# Patient Record
Sex: Female | Born: 1995 | Race: Black or African American | Hispanic: No | Marital: Single | Smoking: Current every day smoker
Health system: Southern US, Community
[De-identification: ages and names within clinical notes are randomized; demographics above are authoritative.]

## PROBLEM LIST (undated history)

## (undated) ENCOUNTER — Inpatient Hospital Stay (HOSPITAL_COMMUNITY): Payer: Self-pay

## (undated) DIAGNOSIS — J45909 Unspecified asthma, uncomplicated: Secondary | ICD-10-CM

## (undated) DIAGNOSIS — N739 Female pelvic inflammatory disease, unspecified: Secondary | ICD-10-CM

## (undated) DIAGNOSIS — F329 Major depressive disorder, single episode, unspecified: Secondary | ICD-10-CM

## (undated) DIAGNOSIS — J302 Other seasonal allergic rhinitis: Secondary | ICD-10-CM

## (undated) DIAGNOSIS — F32A Depression, unspecified: Secondary | ICD-10-CM

## (undated) DIAGNOSIS — G4730 Sleep apnea, unspecified: Secondary | ICD-10-CM

## (undated) DIAGNOSIS — B9689 Other specified bacterial agents as the cause of diseases classified elsewhere: Secondary | ICD-10-CM

## (undated) DIAGNOSIS — R51 Headache: Secondary | ICD-10-CM

## (undated) DIAGNOSIS — K5900 Constipation, unspecified: Secondary | ICD-10-CM

## (undated) DIAGNOSIS — N760 Acute vaginitis: Secondary | ICD-10-CM

## (undated) DIAGNOSIS — D649 Anemia, unspecified: Secondary | ICD-10-CM

## (undated) HISTORY — DX: Unspecified asthma, uncomplicated: J45.909

## (undated) HISTORY — DX: Major depressive disorder, single episode, unspecified: F32.9

## (undated) HISTORY — DX: Depression, unspecified: F32.A

## (undated) HISTORY — DX: Anemia, unspecified: D64.9

## (undated) HISTORY — DX: Sleep apnea, unspecified: G47.30

---
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
## 2014-01-20 ENCOUNTER — Emergency Department (HOSPITAL_COMMUNITY)
Admission: EM | Admit: 2014-01-20 | Discharge: 2014-01-20 | Disposition: A | Discharge status: Home / Self Care | Payer: Medicaid Other | Attending: Emergency Medicine | Admitting: Emergency Medicine

## 2014-01-20 ENCOUNTER — Encounter (HOSPITAL_COMMUNITY): Payer: Self-pay | Admitting: *Deleted

## 2014-01-20 ENCOUNTER — Emergency Department (HOSPITAL_COMMUNITY): Payer: Medicaid Other

## 2014-01-20 DIAGNOSIS — R6883 Chills (without fever): Secondary | ICD-10-CM | POA: Insufficient documentation

## 2014-01-20 DIAGNOSIS — K5900 Constipation, unspecified: Secondary | ICD-10-CM

## 2014-01-20 DIAGNOSIS — Z3202 Encounter for pregnancy test, result negative: Secondary | ICD-10-CM | POA: Diagnosis not present

## 2014-01-20 DIAGNOSIS — Z8742 Personal history of other diseases of the female genital tract: Secondary | ICD-10-CM | POA: Insufficient documentation

## 2014-01-20 DIAGNOSIS — J45909 Unspecified asthma, uncomplicated: Secondary | ICD-10-CM | POA: Insufficient documentation

## 2014-01-20 DIAGNOSIS — R109 Unspecified abdominal pain: Secondary | ICD-10-CM | POA: Diagnosis present

## 2014-01-20 LAB — COMPREHENSIVE METABOLIC PANEL
ALBUMIN: 3.2 g/dL — AB (ref 3.5–5.2)
ALT: 13 U/L (ref 0–35)
AST: 19 U/L (ref 0–37)
Alkaline Phosphatase: 87 U/L (ref 39–117)
Anion gap: 11 (ref 5–15)
BUN: 9 mg/dL (ref 6–23)
CO2: 24 meq/L (ref 19–32)
CREATININE: 0.64 mg/dL (ref 0.50–1.10)
Calcium: 8.9 mg/dL (ref 8.4–10.5)
Chloride: 103 mEq/L (ref 96–112)
GFR calc Af Amer: >90 mL/min (ref >90)
Glucose, Bld: 86 mg/dL (ref 70–99)
Potassium: 4.2 mEq/L (ref 3.7–5.3)
Sodium: 138 mEq/L (ref 137–147)
Total Protein: 6.9 g/dL (ref 6.0–8.3)

## 2014-01-20 LAB — CBC WITH DIFFERENTIAL/PLATELET
Basophils Absolute: 0 10*3/uL (ref 0.0–0.1)
Basophils Relative: 1 % (ref 0–1)
Eosinophils Absolute: 0.3 10*3/uL (ref 0.0–0.7)
Eosinophils Relative: 7 % — ABNORMAL HIGH (ref 0–5)
HEMATOCRIT: 39 % (ref 36.0–46.0)
HEMOGLOBIN: 12.8 g/dL (ref 12.0–15.0)
LYMPHS PCT: 57 % — AB (ref 12–46)
Lymphs Abs: 2.5 10*3/uL (ref 0.7–4.0)
MCH: 28.8 pg (ref 26.0–34.0)
MCHC: 32.8 g/dL (ref 30.0–36.0)
MCV: 87.8 fL (ref 78.0–100.0)
MONO ABS: 0.5 10*3/uL (ref 0.1–1.0)
Monocytes Relative: 11 % (ref 3–12)
NEUTROS PCT: 24 % — AB (ref 43–77)
Neutro Abs: 1 10*3/uL — ABNORMAL LOW (ref 1.7–7.7)
Platelets: 193 10*3/uL (ref 150–400)
RBC: 4.44 MIL/uL (ref 3.87–5.11)
RDW: 12.2 % (ref 11.5–15.5)
WBC: 4.3 10*3/uL (ref 4.0–10.5)

## 2014-01-20 LAB — URINALYSIS, ROUTINE W REFLEX MICROSCOPIC
BILIRUBIN URINE: NEGATIVE
GLUCOSE, UA: NEGATIVE mg/dL
HGB URINE DIPSTICK: NEGATIVE
KETONES UR: NEGATIVE mg/dL
Leukocytes, UA: NEGATIVE
NITRITE: NEGATIVE
PH: 6 (ref 5.0–8.0)
Protein, ur: NEGATIVE mg/dL
SPECIFIC GRAVITY, URINE: 1.011 (ref 1.005–1.030)
Urobilinogen, UA: 1 mg/dL (ref 0.0–1.0)

## 2014-01-20 LAB — LIPASE, BLOOD: LIPASE: 43 U/L (ref 11–59)

## 2014-01-20 LAB — PREGNANCY, URINE: Preg Test, Ur: NEGATIVE

## 2014-01-20 MED ORDER — FLEET ENEMA 7-19 GM/118ML RE ENEM: 1.0000 | ENEMA | Freq: Every day | RECTAL | Status: DC | PRN

## 2014-01-20 MED ORDER — DICYCLOMINE HCL 10 MG PO CAPS
10.0000 mg | ORAL_CAPSULE | Freq: Once | ORAL | Status: AC
  Administered 2014-01-20: 10 mg via ORAL
  Filled 2014-01-20: qty 1

## 2014-01-20 MED ORDER — MAGNESIUM CITRATE PO SOLN: 1.0000 | Freq: Once | ORAL | Status: DC

## 2014-01-20 NOTE — ED Provider Notes (Signed)
CSN: [id number]     Arrival date & time 01/20/14  1618 History   First MD Initiated Contact with Patient 01/20/14 1921     Chief Complaint  Patient presents with  . Abdominal Pain     (Consider location/radiation/quality/duration/timing/severity/associated sxs/prior Treatment) HPI Erica Harrell is a 18 y.o. female who presents to emergency department complaining of abdominal pain. Patient states her pain began 3 days ago. She admits to associated body aches and chills. States today pain worsens. Patient states that her sister told her that she ate "moldy cake." She denies any associated nausea or vomiting. She states she is unable tell bowel movement. Last bowel movement a week ago, however patient states that it is not unusual for her. She reports she has been hospitalized twice for constipation in the past. Patient denies taking any medications prior to coming in. She denies any back pain. No urinary symptoms. No abnormal vaginal discharge or bleeding. Pain is "crampy" mainly in the upper abdomen. Patient states nothing is making her symptoms better or worse.  Past Medical History  Diagnosis Date  . Constipation   . Asthma   . Bacterial vaginosis   . Seasonal allergies   . Headache(784.0)   . Pelvic inflammatory disease    Past Surgical History  Procedure Laterality Date  . Vaginal delivery     History reviewed. No pertinent family history. History  Substance Use Topics  . Smoking status: Never Smoker   . Smokeless tobacco: Not on file  . Alcohol Use: No   OB History    Gravida Para Term Preterm AB TAB SAB Ectopic Multiple Living   1 1 1       1      Review of Systems  Constitutional: Positive for chills. Negative for fever.  Respiratory: Negative for cough, chest tightness and shortness of breath.   Cardiovascular: Negative for chest pain, palpitations and leg swelling.  Gastrointestinal: Positive for abdominal pain and constipation. Negative for nausea, vomiting and  diarrhea.  Genitourinary: Negative for dysuria, flank pain, vaginal bleeding, vaginal discharge, vaginal pain and pelvic pain.  Musculoskeletal: Negative for myalgias, arthralgias, neck pain and neck stiffness.  Skin: Negative for rash.  Neurological: Negative for dizziness, weakness and headaches.  All other systems reviewed and are negative.     Allergies  Shellfish allergy and Zoloft  Home Medications   Prior to Admission medications   Not on File   BP 111/64 mmHg  Pulse 77  Temp(Src) 98.8 F (37.1 C) (Oral)  Resp 16  SpO2 100% Physical Exam  Constitutional: She appears well-developed and well-nourished. No distress.  HENT:  Head: Normocephalic.  Eyes: Conjunctivae are normal.  Neck: Neck supple.  Cardiovascular: Normal rate, regular rhythm and normal heart sounds.   Pulmonary/Chest: Effort normal and breath sounds normal. No respiratory distress. She has no wheezes. She has no rales.  Abdominal: Soft. Bowel sounds are normal. She exhibits no distension. There is tenderness. There is no rebound.  Diffuse tenderness, worse in the left upper quadrant  Musculoskeletal: She exhibits no edema.  Neurological: She is alert.  Skin: Skin is warm and dry.  Psychiatric: She has a normal mood and affect. Her behavior is normal.  Nursing note and vitals reviewed.   ED Course  Procedures (including critical care time) Labs Review Labs Reviewed  CBC WITH DIFFERENTIAL - Abnormal; Notable for the following:    Neutrophils Relative % 24 (*)    Neutro Abs 1.0 (*)    Lymphocytes Relative 57 (*)  Eosinophils Relative 7 (*)    All other components within normal limits  COMPREHENSIVE METABOLIC PANEL - Abnormal; Notable for the following:    Albumin 3.2 (*)    Total Bilirubin <0.2 (*)    All other components within normal limits  URINALYSIS, ROUTINE W REFLEX MICROSCOPIC - Abnormal; Notable for the following:    APPearance CLOUDY (*)    All other components within normal limits   LIPASE, BLOOD  PREGNANCY, URINE  PREGNANCY, URINE    Imaging Review Dg Abd 2 Views  01/20/2014   CLINICAL DATA:  18 year old with abdominal pain for 2 days.  EXAM: ABDOMEN - 2 VIEW  COMPARISON:  05/21/2013  FINDINGS: Negative for free air. Large amount of stool in the abdomen. Gas in the stomach. Nonobstructive bowel gas pattern. No significant gas in the rectum.  IMPRESSION: Large amount of stool in the abdomen.   Electronically Signed   By: Richarda OverlieAdam  Henn M.D.   On: 01/20/2014 21:37     EKG Interpretation None      MDM   Final diagnoses:  Abdominal pain, acute  Constipation, unspecified constipation type    Patient with diffuse abdominal pain for several days, nausea, no bowel movement for a week. Labs and urinalysis obtained. Urine pregnancy obtained. Will also do plain abdomen film to rule out obstruction and to evaluate for stool burden   Patient's lab work and urinalysis unremarkable. Urine pregnancy is negative. X-ray showing large stool. Will treat her constipation, prescribed magnesium citrate, enema. At this time, no evidence of acute abdomen. Pt non toxic appearing  Filed Vitals:   01/20/14 2015 01/20/14 2030 01/20/14 2207 01/20/14 2232  BP: 110/71 116/78 115/66   Pulse: 80 88 78   Temp:    98.9 F (37.2 C)  TempSrc:    Oral  Resp:   16   SpO2: 100% 100% 100%      Myriam Jacobsonatyana A Romain Erion, PA-C 01/21/14 0000  Hilario Quarryanielle S Ray, MD 01/22/14 1141

## 2014-01-20 NOTE — ED Notes (Signed)
Patient transported to X-ray 

## 2014-01-20 NOTE — Discharge Instructions (Signed)
Take magnesium citrate and Fleet enema at the same time. Make sure to drink plenty of fluids. Mirapex daily. Follow with primary care doctor.  Constipation Constipation is when a person has fewer than three bowel movements a week, has difficulty having a bowel movement, or has stools that are dry, hard, or larger than normal. As people grow older, constipation is more common. If you try to fix constipation with medicines that make you have a bowel movement (laxatives), the problem may get worse. Long-term laxative use may cause the muscles of the colon to become weak. A low-fiber diet, not taking in enough fluids, and taking certain medicines may make constipation worse.  CAUSES   Certain medicines, such as antidepressants, pain medicine, iron supplements, antacids, and water pills.   Certain diseases, such as diabetes, irritable bowel syndrome (IBS), thyroid disease, or depression.   Not drinking enough water.   Not eating enough fiber-rich foods.   Stress or travel.   Lack of physical activity or exercise.   Ignoring the urge to have a bowel movement.   Using laxatives too much.  SIGNS AND SYMPTOMS   Having fewer than three bowel movements a week.   Straining to have a bowel movement.   Having stools that are hard, dry, or larger than normal.   Feeling full or bloated.   Pain in the lower abdomen.   Not feeling relief after having a bowel movement.  DIAGNOSIS  Your health care provider will take a medical history and perform a physical exam. Further testing may be done for severe constipation. Some tests may include:  A barium enema X-ray to examine your rectum, colon, and, sometimes, your small intestine.   A sigmoidoscopy to examine your lower colon.   A colonoscopy to examine your entire colon. TREATMENT  Treatment will depend on the severity of your constipation and what is causing it. Some dietary treatments include drinking more fluids and eating  more fiber-rich foods. Lifestyle treatments may include regular exercise. If these diet and lifestyle recommendations do not help, your health care provider may recommend taking over-the-counter laxative medicines to help you have bowel movements. Prescription medicines may be prescribed if over-the-counter medicines do not work.  HOME CARE INSTRUCTIONS   Eat foods that have a lot of fiber, such as fruits, vegetables, whole grains, and beans.  Limit foods high in fat and processed sugars, such as french fries, hamburgers, cookies, candies, and soda.   A fiber supplement may be added to your diet if you cannot get enough fiber from foods.   Drink enough fluids to keep your urine clear or pale yellow.   Exercise regularly or as directed by your health care provider.   Go to the restroom when you have the urge to go. Do not hold it.   Only take over-the-counter or prescription medicines as directed by your health care provider. Do not take other medicines for constipation without talking to your health care provider first.  SEEK IMMEDIATE MEDICAL CARE IF:   You have bright red blood in your stool.   Your constipation lasts for more than 4 days or gets worse.   You have abdominal or rectal pain.   You have thin, pencil-like stools.   You have unexplained weight loss. MAKE SURE YOU:   Understand these instructions.  Will watch your condition.  Will get help right away if you are not doing well or get worse. Document Released: 11/06/2003 Document Revised: 02/12/2013 Document Reviewed: 11/19/2012 [employer] Patient Information  2015 [employer]. This information is not intended to replace advice given to you by your health care provider. Make sure you discuss any questions you have with your health care provider. ° °

## 2014-01-20 NOTE — ED Notes (Signed)
Pt in c/o abd pain since Friday with chills and body aches, states she thinks she ate moldy cake a few days ago. Pt reports nausea but denies vomiting.

## 2014-05-10 ENCOUNTER — Emergency Department (HOSPITAL_COMMUNITY)
Admission: EM | Admit: 2014-05-10 | Discharge: 2014-05-11 | Disposition: A | Discharge status: Home / Self Care | Payer: Medicaid Other | Attending: Emergency Medicine | Admitting: Emergency Medicine

## 2014-05-10 ENCOUNTER — Encounter (HOSPITAL_COMMUNITY): Payer: Self-pay | Admitting: Emergency Medicine

## 2014-05-10 DIAGNOSIS — R079 Chest pain, unspecified: Secondary | ICD-10-CM | POA: Insufficient documentation

## 2014-05-10 DIAGNOSIS — F1010 Alcohol abuse, uncomplicated: Secondary | ICD-10-CM | POA: Insufficient documentation

## 2014-05-10 DIAGNOSIS — Y999 Unspecified external cause status: Secondary | ICD-10-CM | POA: Insufficient documentation

## 2014-05-10 DIAGNOSIS — Z3202 Encounter for pregnancy test, result negative: Secondary | ICD-10-CM | POA: Insufficient documentation

## 2014-05-10 DIAGNOSIS — Y939 Activity, unspecified: Secondary | ICD-10-CM | POA: Insufficient documentation

## 2014-05-10 DIAGNOSIS — T404X4A Poisoning by other synthetic narcotics, undetermined, initial encounter: Secondary | ICD-10-CM | POA: Diagnosis not present

## 2014-05-10 DIAGNOSIS — Z8742 Personal history of other diseases of the female genital tract: Secondary | ICD-10-CM | POA: Insufficient documentation

## 2014-05-10 DIAGNOSIS — K5900 Constipation, unspecified: Secondary | ICD-10-CM | POA: Diagnosis not present

## 2014-05-10 DIAGNOSIS — J45909 Unspecified asthma, uncomplicated: Secondary | ICD-10-CM | POA: Diagnosis not present

## 2014-05-10 DIAGNOSIS — IMO0001 Reserved for inherently not codable concepts without codable children: Secondary | ICD-10-CM

## 2014-05-10 DIAGNOSIS — Y929 Unspecified place or not applicable: Secondary | ICD-10-CM | POA: Diagnosis not present

## 2014-05-10 DIAGNOSIS — Z789 Other specified health status: Secondary | ICD-10-CM

## 2014-05-10 DIAGNOSIS — F10129 Alcohol abuse with intoxication, unspecified: Secondary | ICD-10-CM | POA: Diagnosis present

## 2014-05-10 LAB — BASIC METABOLIC PANEL
Anion gap: 5 (ref 5–15)
BUN: 10 mg/dL (ref 6–23)
CALCIUM: 8.6 mg/dL (ref 8.4–10.5)
CO2: 27 mmol/L (ref 19–32)
CREATININE: 0.76 mg/dL (ref 0.50–1.10)
Chloride: 105 mmol/L (ref 96–112)
GFR calc Af Amer: >90 mL/min (ref >90)
GFR calc non Af Amer: >90 mL/min (ref >90)
GLUCOSE: 86 mg/dL (ref 70–99)
Potassium: 3.5 mmol/L (ref 3.5–5.1)
Sodium: 137 mmol/L (ref 135–145)

## 2014-05-10 LAB — CBC
HCT: 37.8 % (ref 36.0–46.0)
Hemoglobin: 12.6 g/dL (ref 12.0–15.0)
MCH: 29.7 pg (ref 26.0–34.0)
MCHC: 33.3 g/dL (ref 30.0–36.0)
MCV: 89.2 fL (ref 78.0–100.0)
Platelets: 254 10*3/uL (ref 150–400)
RBC: 4.24 MIL/uL (ref 3.87–5.11)
RDW: 12 % (ref 11.5–15.5)
WBC: 6 10*3/uL (ref 4.0–10.5)

## 2014-05-10 LAB — I-STAT TROPONIN, ED: TROPONIN I, POC: 0 ng/mL (ref 0.00–0.08)
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
## 2014-05-11 LAB — RAPID URINE DRUG SCREEN, HOSP PERFORMED
AMPHETAMINES: NOT DETECTED
BENZODIAZEPINES: NOT DETECTED
Barbiturates: NOT DETECTED
Cocaine: NOT DETECTED
Opiates: NOT DETECTED
Tetrahydrocannabinol: NOT DETECTED

## 2014-05-11 LAB — POC URINE PREG, ED: Preg Test, Ur: NEGATIVE

## 2014-05-11 LAB — ETHANOL: ALCOHOL ETHYL (B): 34 mg/dL — AB (ref 0–9)

## 2014-05-11 NOTE — ED Notes (Signed)
MD at bedside. 

## 2014-05-11 NOTE — Discharge Instructions (Signed)
Return to the emergency department if you experience any new or worsening symptoms.   Alcohol Intoxication Alcohol intoxication occurs when the amount of alcohol that a person has consumed impairs his or her ability to mentally and physically function. Alcohol directly impairs the normal chemical activity of the brain. Drinking large amounts of alcohol can lead to changes in mental function and behavior, and it can cause many physical effects that can be harmful.  Alcohol intoxication can range in severity from mild to very severe. Various factors can affect the level of intoxication that occurs, such as the person's age, gender, weight, frequency of alcohol consumption, and the presence of other medical conditions (such as diabetes, seizures, or heart conditions). Dangerous levels of alcohol intoxication may occur when people drink large amounts of alcohol in a short period (binge drinking). Alcohol can also be especially dangerous when combined with certain prescription medicines or "recreational" drugs. SIGNS AND SYMPTOMS Some common signs and symptoms of mild alcohol intoxication include:  Loss of coordination.  Changes in mood and behavior.  Impaired judgment.  Slurred speech. As alcohol intoxication progresses to more severe levels, other signs and symptoms will appear. These may include:  Vomiting.  Confusion and impaired memory.  Slowed breathing.  Seizures.  Loss of consciousness. DIAGNOSIS  Your health care provider will take a medical history and perform a physical exam. You will be asked about the amount and type of alcohol you have consumed. Blood tests will be done to measure the concentration of alcohol in your blood. In many places, your blood alcohol level must be lower than 80 mg/dL (1.61%) to legally drive. However, many dangerous effects of alcohol can occur at much lower levels.  TREATMENT  People with alcohol intoxication often do not require treatment. Most of the  effects of alcohol intoxication are temporary, and they go away as the alcohol naturally leaves the body. Your health care provider will monitor your condition until you are stable enough to go home. Fluids are sometimes given through an IV access tube to help prevent dehydration.  HOME CARE INSTRUCTIONS  Do not drive after drinking alcohol.  Stay hydrated. Drink enough water and fluids to keep your urine clear or pale yellow. Avoid caffeine.   Only take over-the-counter or prescription medicines as directed by your health care provider.  SEEK MEDICAL CARE IF:   You have persistent vomiting.   You do not feel better after a few days.  You have frequent alcohol intoxication. Your health care provider can help determine if you should see a substance use treatment counselor. SEEK IMMEDIATE MEDICAL CARE IF:   You become shaky or tremble when you try to stop drinking.   You shake uncontrollably (seizure).   You throw up (vomit) blood. This may be bright red or may look like black coffee grounds.   You have blood in your stool. This may be bright red or may appear as a black, tarry, bad smelling stool.   You become lightheaded or faint.  MAKE SURE YOU:   Understand these instructions.  Will watch your condition.  Will get help right away if you are not doing well or get worse. Document Released: 11/17/2004 Document Revised: 10/10/2012 Document Reviewed: 07/13/2012 [facility] Patient Information 2015 [place], [place]. This information is not intended to replace advice given to you by your health care provider. Make sure you discuss any questions you have with your health care provider.  Ethanol This is a test of the blood alcohol level and  is used to determine whether your level will impair your driving or other activities and also to determine the possibility of overdose (alcohol poisoning). PREPARATION FOR TEST No preparation or fasting is necessary. NORMAL  FINDINGS  Negative  Possible critical values are greater than 300 mg/dl or greater than 65 mmol/L (SI units). Ranges for normal findings may vary among different laboratories and hospitals. You should always check with your doctor after having lab work or other tests done to discuss the meaning of your test results and whether your values are considered within normal limits. MEANING OF TEST  Your caregiver will go over the test results with you and discuss the importance and meaning of your results, as well as treatment options and the need for additional tests if necessary. OBTAINING THE TEST RESULTS  It is your responsibility to obtain your test results. Ask the lab or department performing the test when and how you will get your results. Document Released: 03/02/2004 Document Revised: 05/02/2011 Document Reviewed: 01/18/2008 [facility] Patient Information 2015 [place]. This information is not intended to replace advice given to you by your health care provider. Make sure you discuss any questions you have with your health care provider.

## 2014-07-21 ENCOUNTER — Emergency Department (HOSPITAL_COMMUNITY): Payer: Medicaid Other

## 2014-07-21 ENCOUNTER — Encounter (HOSPITAL_COMMUNITY): Payer: Self-pay | Admitting: *Deleted

## 2014-07-21 ENCOUNTER — Emergency Department (HOSPITAL_COMMUNITY)
Admission: EM | Admit: 2014-07-21 | Discharge: 2014-07-21 | Disposition: A | Discharge status: Home / Self Care | Payer: Medicaid Other | Attending: Emergency Medicine | Admitting: Emergency Medicine

## 2014-07-21 DIAGNOSIS — S70211A Abrasion, right hip, initial encounter: Secondary | ICD-10-CM | POA: Insufficient documentation

## 2014-07-21 DIAGNOSIS — S60512A Abrasion of left hand, initial encounter: Secondary | ICD-10-CM | POA: Diagnosis not present

## 2014-07-21 DIAGNOSIS — Y929 Unspecified place or not applicable: Secondary | ICD-10-CM | POA: Diagnosis not present

## 2014-07-21 DIAGNOSIS — R42 Dizziness and giddiness: Secondary | ICD-10-CM | POA: Insufficient documentation

## 2014-07-21 DIAGNOSIS — J45909 Unspecified asthma, uncomplicated: Secondary | ICD-10-CM | POA: Insufficient documentation

## 2014-07-21 DIAGNOSIS — S50812A Abrasion of left forearm, initial encounter: Secondary | ICD-10-CM | POA: Insufficient documentation

## 2014-07-21 DIAGNOSIS — Z8719 Personal history of other diseases of the digestive system: Secondary | ICD-10-CM | POA: Insufficient documentation

## 2014-07-21 DIAGNOSIS — Z3202 Encounter for pregnancy test, result negative: Secondary | ICD-10-CM | POA: Diagnosis not present

## 2014-07-21 DIAGNOSIS — T07XXXA Unspecified multiple injuries, initial encounter: Secondary | ICD-10-CM

## 2014-07-21 DIAGNOSIS — Z8742 Personal history of other diseases of the female genital tract: Secondary | ICD-10-CM | POA: Insufficient documentation

## 2014-07-21 DIAGNOSIS — Z8619 Personal history of other infectious and parasitic diseases: Secondary | ICD-10-CM | POA: Insufficient documentation

## 2014-07-21 DIAGNOSIS — Y999 Unspecified external cause status: Secondary | ICD-10-CM | POA: Diagnosis not present

## 2014-07-21 DIAGNOSIS — Y9389 Activity, other specified: Secondary | ICD-10-CM | POA: Insufficient documentation

## 2014-07-21 DIAGNOSIS — S3992XA Unspecified injury of lower back, initial encounter: Secondary | ICD-10-CM | POA: Insufficient documentation

## 2014-07-21 DIAGNOSIS — S0990XA Unspecified injury of head, initial encounter: Secondary | ICD-10-CM | POA: Insufficient documentation

## 2014-07-21 DIAGNOSIS — R52 Pain, unspecified: Secondary | ICD-10-CM

## 2014-07-21 DIAGNOSIS — S8012XA Contusion of left lower leg, initial encounter: Secondary | ICD-10-CM | POA: Diagnosis not present

## 2014-07-21 LAB — POC URINE PREG, ED: Preg Test, Ur: NEGATIVE

## 2014-07-21 MED ORDER — IBUPROFEN 400 MG PO TABS
600.0000 mg | ORAL_TABLET | Freq: Once | ORAL | Status: AC
  Administered 2014-07-21: 600 mg via ORAL
  Filled 2014-07-21: qty 2

## 2014-07-21 MED ORDER — IBUPROFEN 800 MG PO TABS: 800.0000 mg | ORAL_TABLET | Freq: Three times a day (TID) | ORAL | Status: DC | PRN

## 2014-07-21 MED ORDER — CYCLOBENZAPRINE HCL 10 MG PO TABS: 10.0000 mg | ORAL_TABLET | Freq: Three times a day (TID) | ORAL | Status: DC | PRN
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
## 2014-07-21 NOTE — ED Notes (Signed)
Declined W/C at D/C and was escorted to lobby by RN. 

## 2014-07-21 NOTE — Discharge Instructions (Signed)
Read the information below.  Use the prescribed medication as directed.  Please discuss all new medications with your pharmacist.  You may return to the Emergency Department at any time for worsening condition or any new symptoms that concern you.   If you develop redness, swelling, pus draining from the wound, or fevers greater than 100.4, return to the ER immediately for a recheck.   ° ° °Abrasions °An abrasion is a cut or scrape of the skin. Abrasions do not go through all layers of the skin. °HOME CARE °· If a bandage (dressing) was put on your wound, change it as told by your doctor. If the bandage sticks, soak it off with warm. °· Wash the area with water and soap 2 times a day. Rinse off the soap. Pat the area dry with a clean towel. °· Put on medicated cream (ointment) as told by your doctor. °· Change your bandage right away if it gets wet or dirty. °· Only take medicine as told by your doctor. °· See your doctor within 24-48 hours to get your wound checked. °· Check your wound for redness, puffiness (swelling), or yellowish-white fluid (pus). °GET HELP RIGHT AWAY IF:  °· You have more pain in the wound. °· You have redness, swelling, or tenderness around the wound. °· You have pus coming from the wound. °· You have a fever or lasting symptoms for more than 2-3 days. °· You have a fever and your symptoms suddenly get worse. °· You have a bad smell coming from the wound or bandage. °MAKE SURE YOU:  °· Understand these instructions. °· Will watch your condition. °· Will get help right away if you are not doing well or get worse. °Document Released: 07/27/2007 Document Revised: 11/02/2011 Document Reviewed: 01/11/2011 °[employer]® Patient Information ©2015 [employer]. This information is not intended to replace advice given to you by your health care provider. Make sure you discuss any questions you have with your health care provider. ° °
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
## 2014-09-19 ENCOUNTER — Emergency Department (HOSPITAL_COMMUNITY)
Admission: EM | Admit: 2014-09-19 | Discharge: 2014-09-19 | Disposition: A | Discharge status: Home / Self Care | Payer: Medicaid Other | Source: Home / Self Care | Attending: Emergency Medicine | Admitting: Emergency Medicine

## 2014-09-19 ENCOUNTER — Emergency Department (HOSPITAL_COMMUNITY): Payer: Medicaid Other

## 2014-09-19 ENCOUNTER — Encounter (HOSPITAL_COMMUNITY): Payer: Self-pay | Admitting: Emergency Medicine

## 2014-09-19 ENCOUNTER — Emergency Department (HOSPITAL_COMMUNITY)
Admission: EM | Admit: 2014-09-19 | Discharge: 2014-09-19 | Discharge status: Against Medical Advice | Payer: Medicaid Other | Attending: Emergency Medicine | Admitting: Emergency Medicine

## 2014-09-19 DIAGNOSIS — O9989 Other specified diseases and conditions complicating pregnancy, childbirth and the puerperium: Secondary | ICD-10-CM

## 2014-09-19 DIAGNOSIS — Z202 Contact with and (suspected) exposure to infections with a predominantly sexual mode of transmission: Secondary | ICD-10-CM

## 2014-09-19 DIAGNOSIS — J45909 Unspecified asthma, uncomplicated: Secondary | ICD-10-CM | POA: Insufficient documentation

## 2014-09-19 DIAGNOSIS — Z3A01 Less than 8 weeks gestation of pregnancy: Secondary | ICD-10-CM | POA: Insufficient documentation

## 2014-09-19 DIAGNOSIS — Z3490 Encounter for supervision of normal pregnancy, unspecified, unspecified trimester: Secondary | ICD-10-CM

## 2014-09-19 DIAGNOSIS — Z760 Encounter for issue of repeat prescription: Secondary | ICD-10-CM | POA: Insufficient documentation

## 2014-09-19 DIAGNOSIS — Z8742 Personal history of other diseases of the female genital tract: Secondary | ICD-10-CM | POA: Insufficient documentation

## 2014-09-19 DIAGNOSIS — O99511 Diseases of the respiratory system complicating pregnancy, first trimester: Secondary | ICD-10-CM | POA: Insufficient documentation

## 2014-09-19 DIAGNOSIS — K5900 Constipation, unspecified: Secondary | ICD-10-CM | POA: Insufficient documentation

## 2014-09-19 DIAGNOSIS — Z8719 Personal history of other diseases of the digestive system: Secondary | ICD-10-CM | POA: Insufficient documentation

## 2014-09-19 DIAGNOSIS — R52 Pain, unspecified: Secondary | ICD-10-CM

## 2014-09-19 DIAGNOSIS — R109 Unspecified abdominal pain: Secondary | ICD-10-CM

## 2014-09-19 DIAGNOSIS — Z711 Person with feared health complaint in whom no diagnosis is made: Secondary | ICD-10-CM

## 2014-09-19 DIAGNOSIS — O99611 Diseases of the digestive system complicating pregnancy, first trimester: Secondary | ICD-10-CM | POA: Diagnosis not present

## 2014-09-19 LAB — URINALYSIS, ROUTINE W REFLEX MICROSCOPIC
Bilirubin Urine: NEGATIVE
Glucose, UA: NEGATIVE mg/dL
HGB URINE DIPSTICK: NEGATIVE
Ketones, ur: NEGATIVE mg/dL
NITRITE: NEGATIVE
PH: 6 (ref 5.0–8.0)
Protein, ur: NEGATIVE mg/dL
SPECIFIC GRAVITY, URINE: 1.018 (ref 1.005–1.030)
Urobilinogen, UA: 1 mg/dL (ref 0.0–1.0)

## 2014-09-19 LAB — CBC
HEMATOCRIT: 39.1 % (ref 36.0–46.0)
HEMOGLOBIN: 13.2 g/dL (ref 12.0–15.0)
MCH: 30.1 pg (ref 26.0–34.0)
MCHC: 33.8 g/dL (ref 30.0–36.0)
MCV: 89.3 fL (ref 78.0–100.0)
Platelets: 279 10*3/uL (ref 150–400)
RBC: 4.38 MIL/uL (ref 3.87–5.11)
RDW: 12.4 % (ref 11.5–15.5)
WBC: 6.8 10*3/uL (ref 4.0–10.5)

## 2014-09-19 LAB — HCG, QUANTITATIVE, PREGNANCY: hCG, Beta Chain, Quant, S: 673 m[IU]/mL — ABNORMAL HIGH (ref <5)

## 2014-09-19 LAB — COMPREHENSIVE METABOLIC PANEL
ALBUMIN: 3.2 g/dL — AB (ref 3.5–5.0)
ALK PHOS: 88 U/L (ref 38–126)
ALT: 18 U/L (ref 14–54)
AST: 19 U/L (ref 15–41)
Anion gap: 8 (ref 5–15)
BUN: 6 mg/dL (ref 6–20)
CHLORIDE: 103 mmol/L (ref 101–111)
CO2: 25 mmol/L (ref 22–32)
Calcium: 9.1 mg/dL (ref 8.9–10.3)
Creatinine, Ser: 0.67 mg/dL (ref 0.44–1.00)
GLUCOSE: 104 mg/dL — AB (ref 65–99)
POTASSIUM: 3.7 mmol/L (ref 3.5–5.1)
Sodium: 136 mmol/L (ref 135–145)
TOTAL PROTEIN: 6.6 g/dL (ref 6.5–8.1)
Total Bilirubin: 0.4 mg/dL (ref 0.3–1.2)

## 2014-09-19 LAB — WET PREP, GENITAL: Yeast Wet Prep HPF POC: NONE SEEN

## 2014-09-19 LAB — LIPASE, BLOOD: Lipase: 31 U/L (ref 22–51)

## 2014-09-19 LAB — URINE MICROSCOPIC-ADD ON

## 2014-09-19 LAB — POC URINE PREG, ED: PREG TEST UR: POSITIVE — AB

## 2014-09-19 MED ORDER — AZITHROMYCIN 250 MG PO TABS: 1000.0000 mg | ORAL_TABLET | Freq: Once | ORAL | Status: DC

## 2014-09-19 MED ORDER — AZITHROMYCIN 250 MG PO TABS
1000.0000 mg | ORAL_TABLET | Freq: Once | ORAL | Status: AC
  Administered 2014-09-19: 1000 mg via ORAL
  Filled 2014-09-19: qty 4

## 2014-09-19 MED ORDER — METRONIDAZOLE 500 MG PO TABS: 500.0000 mg | ORAL_TABLET | Freq: Two times a day (BID) | ORAL | Status: DC

## 2014-09-19 MED ORDER — CEFTRIAXONE SODIUM 250 MG IJ SOLR
250.0000 mg | Freq: Once | INTRAMUSCULAR | Status: AC
  Administered 2014-09-19: 250 mg via INTRAMUSCULAR
  Filled 2014-09-19: qty 250

## 2014-09-19 MED ORDER — CEFTRIAXONE SODIUM 250 MG IJ SOLR: 250.0000 mg | Freq: Once | INTRAMUSCULAR | Status: DC

## 2014-09-19 NOTE — ED Provider Notes (Signed)
CSN: [id number]     Arrival date & time 09/19/14  2148 History   First MD Initiated Contact with Patient 09/19/14 2152     Chief Complaint  Patient presents with  . Medication Refill   This patient was just seen in the emergency department immediately if you hours ago and she eloped prior to receiving her antibiotics and discharge information. Patient returned and reports that she left because she needed to go pick up her child. Patient returned so she could receive her antibiotics. Patient is newly pregnant. There is concern for an STD without a diagnosis. Patient also has trich.   (Consider location/radiation/quality/duration/timing/severity/associated sxs/prior Treatment) HPI  Past Medical History  Diagnosis Date  . Constipation   . Asthma   . Bacterial vaginosis   . Seasonal allergies   . Headache(784.0)   . Pelvic inflammatory disease    Past Surgical History  Procedure Laterality Date  . Vaginal delivery     No family history on file. History  Substance Use Topics  . Smoking status: Never Smoker   . Smokeless tobacco: Not on file  . Alcohol Use: No   OB History    Gravida Para Term Preterm AB TAB SAB Ectopic Multiple Living   1 1 1       1      Review of Systems  Constitutional: Negative for fever and chills.  Gastrointestinal: Negative for vomiting and abdominal pain.      Allergies  Shellfish allergy and Zoloft  Home Medications   Prior to Admission medications   Medication Sig Start Date End Date Taking? Authorizing Provider  cyclobenzaprine (FLEXERIL) 10 MG tablet Take 1 tablet (10 mg total) by mouth 3 (three) times daily as needed for muscle spasms (or pain). Patient not taking: Reported on 09/19/2014 07/21/14   Trixie Dredge, PA-C  ibuprofen (ADVIL,MOTRIN) 800 MG tablet Take 1 tablet (800 mg total) by mouth every 8 (eight) hours as needed for mild pain or moderate pain. Patient not taking: Reported on 09/19/2014 07/21/14   Trixie Dredge, PA-C  magnesium citrate  SOLN Take 296 mLs (1 Bottle total) by mouth once. Patient not taking: Reported on 05/11/2014 01/20/14   Tatyana Kirichenko, PA-C  metroNIDAZOLE (FLAGYL) 500 MG tablet Take 1 tablet (500 mg total) by mouth 2 (two) times daily. 09/19/14   Everlene Farrier, PA-C  sodium phosphate (FLEET) 7-19 GM/118ML ENEM Place 133 mLs (1 enema total) rectally daily as needed for severe constipation. Patient not taking: Reported on 05/11/2014 01/20/14   Tatyana Kirichenko, PA-C   BP 111/66 mmHg  Pulse 92  Temp(Src) 98.3 F (36.8 C) (Oral)  Resp 18  Ht 5\' 4"  (1.626 m)  Wt 150 lb (68.04 kg)  BMI 25.73 kg/m2  SpO2 100%  LMP 08/12/2014 (Exact Date) Physical Exam  Constitutional: She appears well-developed and well-nourished. No distress.  Nontoxic appearing. The patient was just evaluated in the emergency department a few hours ago.   HENT:  Head: Normocephalic and atraumatic.  Eyes: Right eye exhibits no discharge. Left eye exhibits no discharge.  Pulmonary/Chest: Effort normal. No respiratory distress.  Neurological: She is alert. Coordination normal.  Skin: No rash noted. She is not diaphoretic.  Psychiatric: She has a normal mood and affect. Her behavior is normal.  Nursing note and vitals reviewed.   ED Course  Procedures (including critical care time) Labs Review Labs Reviewed - No data to display  Imaging Review US Ob Comp Less 14 Wks  09/19/2014   CLINICAL DATA:  LEFT  lower quadrant pain in LEFT upper quadrant pain. Positive urine pregnancy test.  EXAM: OBSTETRIC <14 WK [place] AND TRANSVAGINAL OB US  TECHNIQUE: Both transabdominal and transvaginal ultrasound examinations were performed for complete evaluation of the gestation as well as the maternal uterus, adnexal regions, and pelvic cul-de-sac. Transvaginal technique was performed to assess early pregnancy.  COMPARISON:  None.  FINDINGS: Intrauterine gestational sac: Not identified  Yolk sac:  Not identified  Embryo:  Not identified  Maternal  uterus/adnexae: Ovaries are normal with small follicles. RIGHT ovary measures 4.2 x 2.3 x 2.4 cm. The LEFT ovary measures 2.9 x 1.3 x 2.0 cm. Uterus and endometrium appear normal. No free fluid.  IMPRESSION: No intrauterine gestation. Differential includes early intrauterine gestation, spontaneous abortion in progress or occult ectopic pregnancy.   Electronically Signed   By: Genevive Bi M.D.   On: 09/19/2014 18:53   US Ob Transvaginal  09/19/2014   CLINICAL DATA:  LEFT lower quadrant pain in LEFT upper quadrant pain. Positive urine pregnancy test.  EXAM: OBSTETRIC <14 WK [place] AND TRANSVAGINAL OB US  TECHNIQUE: Both transabdominal and transvaginal ultrasound examinations were performed for complete evaluation of the gestation as well as the maternal uterus, adnexal regions, and pelvic cul-de-sac. Transvaginal technique was performed to assess early pregnancy.  COMPARISON:  None.  FINDINGS: Intrauterine gestational sac: Not identified  Yolk sac:  Not identified  Embryo:  Not identified  Maternal uterus/adnexae: Ovaries are normal with small follicles. RIGHT ovary measures 4.2 x 2.3 x 2.4 cm. The LEFT ovary measures 2.9 x 1.3 x 2.0 cm. Uterus and endometrium appear normal. No free fluid.  IMPRESSION: No intrauterine gestation. Differential includes early intrauterine gestation, spontaneous abortion in progress or occult ectopic pregnancy.   Electronically Signed   By: Genevive Bi M.D.   On: 09/19/2014 18:53     EKG Interpretation None      Filed Vitals:   09/19/14 2157 09/19/14 2158  BP:  111/66  Pulse:  92  Temp:  98.3 F (36.8 C)  TempSrc:  Oral  Resp:  18  Height: 5\' 4"  (1.626 m)   Weight: 150 lb (68.04 kg)   SpO2:  100%     MDM   Meds given in ED:  Medications  cefTRIAXone (ROCEPHIN) injection 250 mg (250 mg Intramuscular Given 09/19/14 2204)  azithromycin (ZITHROMAX) tablet 1,000 mg (1,000 mg Oral Given 09/19/14 2208)    Discharge Medication List as of 09/19/2014 10:05 PM     START taking these medications   Details  metroNIDAZOLE (FLAGYL) 500 MG tablet Take 1 tablet (500 mg total) by mouth 2 (two) times daily., Starting 09/19/2014, Until Discontinued, Print        Final diagnoses:  Pregnant  Concern about STD in female without diagnosis  Intermittent abdominal pain   This patient was seen in the emergency department a few hours ago by me. The patient eloped prior to receiving her azithromycin, Rocephin and prescription for Flagyl. Please see my earlier note for further. The Patient returned to receive her antibiotics and discharge instructions. There is concern for an STD without diagnosis. Patient has few trichomonas, few white blood cells and few cells on her wet prep. Patient has moderate leukocytes on her urinalysis. Patient denies any urinary symptoms. She denies any vaginal discharge. Patient's urine sent for culture. Patient received Rocephin and azithromycin in the emergency department. Discharged with prescription for Flagyl. I advised patient she is to follow-up with the women's outpatient clinic for next week to ensure  that she has a repeat ultrasound to ensure she does not have an ectopic pregnancy. I advised the patient to follow-up with their primary care provider this week. I advised the patient to return to the emergency department with new or worsening symptoms or new concerns. The patient verbalized understanding and agreement with plan.    This patient was discussed with Dr. Loretha Stapler who agrees with assessment and plan.    Everlene Farrier, PA-C 09/19/14 2303  Blake Divine, MD 09/20/14 [phone]

## 2014-09-19 NOTE — Discharge Instructions (Signed)
Abdominal Pain During Pregnancy °Abdominal pain is common in pregnancy. Most of the time, it does not cause harm. There are many causes of abdominal pain. Some causes are more serious than others. Some of the causes of abdominal pain in pregnancy are easily diagnosed. Occasionally, the diagnosis takes time to understand. Other times, the cause is not determined. Abdominal pain can be a sign that something is very wrong with the pregnancy, or the pain may have nothing to do with the pregnancy at all. For this reason, always tell your health care provider if you have any abdominal discomfort. °HOME CARE INSTRUCTIONS  °Monitor your abdominal pain for any changes. The following actions may help to alleviate any discomfort you are experiencing: °· Do not have sexual intercourse or put anything in your vagina until your symptoms go away completely. °· Get plenty of rest until your pain improves. °· Drink clear fluids if you feel nauseous. Avoid solid food as long as you are uncomfortable or nauseous. °· Only take over-the-counter or prescription medicine as directed by your health care provider. °· Keep all follow-up appointments with your health care provider. °SEEK IMMEDIATE MEDICAL CARE IF: °· You are bleeding, leaking fluid, or passing tissue from the vagina. °· You have increasing pain or cramping. °· You have persistent vomiting. °· You have painful or bloody urination. °· You have a fever. °· You notice a decrease in your baby's movements. °· You have extreme weakness or feel faint. °· You have shortness of breath, with or without abdominal pain. °· You develop a severe headache with abdominal pain. °· You have abnormal vaginal discharge with abdominal pain. °· You have persistent diarrhea. °· You have abdominal pain that continues even after rest, or gets worse. °MAKE SURE YOU:  °· Understand these instructions. °· Will watch your condition. °· Will get help right away if you are not doing well or get  worse. °Document Released: 02/07/2005 Document Revised: 11/28/2012 Document Reviewed: 09/06/2012 °Exit[employer]® Patient Information ©2015 [employer]. This information is not intended to replace advice given to you by your health care provider. Make sure you discuss any questions you have with your health care provider. °First Trimester of Pregnancy °The first trimester of pregnancy is from week 1 until the end of week 12 (months 1 through 3). A week after a sperm fertilizes an egg, the egg will implant on the wall of the uterus. This embryo will begin to develop into a baby. Genes from you and your partner are forming the baby. The female genes determine whether the baby is a boy or a girl. At 6-8 weeks, the eyes and face are formed, and the heartbeat can be seen on ultrasound. At the end of 12 weeks, all the baby's organs are formed.  °Now that you are pregnant, you will want to do everything you can to have a healthy baby. Two of the most important things are to get good prenatal care and to follow your health care provider's instructions. Prenatal care is all the medical care you receive before the baby's birth. This care will help prevent, find, and treat any problems during the pregnancy and childbirth. °BODY CHANGES °Your body goes through many changes during pregnancy. The changes vary from woman to woman.  °· You may gain or lose a couple of pounds at first. °· You may feel sick to your stomach (nauseous) and throw up (vomit). If the vomiting is uncontrollable, call your health care provider. °· You may tire easily. °·   You may develop headaches that can be relieved by medicines approved by your health care provider. °· You may urinate more often. Painful urination may mean you have a bladder infection. °· You may develop heartburn as a result of your pregnancy. °· You may develop constipation because certain hormones are causing the muscles that push waste through your intestines to slow down. °· You may  develop hemorrhoids or swollen, bulging veins (varicose veins). °· Your breasts may begin to grow larger and become tender. Your nipples may stick out more, and the tissue that surrounds them (areola) may become darker. °· Your gums may bleed and may be sensitive to brushing and flossing. °· Dark spots or blotches (chloasma, mask of pregnancy) may develop on your face. This will likely fade after the baby is born. °· Your menstrual periods will stop. °· You may have a loss of appetite. °· You may develop cravings for certain kinds of food. °· You may have changes in your emotions from day to day, such as being excited to be pregnant or being concerned that something may go wrong with the pregnancy and baby. °· You may have more vivid and strange dreams. °· You may have changes in your hair. These can include thickening of your hair, rapid growth, and changes in texture. Some women also have hair loss during or after pregnancy, or hair that feels dry or thin. Your hair will most likely return to normal after your baby is born. °WHAT TO EXPECT AT YOUR PRENATAL VISITS °During a routine prenatal visit: °· You will be weighed to make sure you and the baby are growing normally. °· Your blood pressure will be taken. °· Your abdomen will be measured to track your baby's growth. °· The fetal heartbeat will be listened to starting around week 10 or 12 of your pregnancy. °· Test results from any previous visits will be discussed. °Your health care provider may ask you: °· How you are feeling. °· If you are feeling the baby move. °· If you have had any abnormal symptoms, such as leaking fluid, bleeding, severe headaches, or abdominal cramping. °· If you have any questions. °Other tests that may be performed during your first trimester include: °· Blood tests to find your blood type and to check for the presence of any previous infections. They will also be used to check for low iron levels (anemia) and Rh antibodies. Later in  the pregnancy, blood tests for diabetes will be done along with other tests if problems develop. °· Urine tests to check for infections, diabetes, or protein in the urine. °· An ultrasound to confirm the proper growth and development of the baby. °· An amniocentesis to check for possible genetic problems. °· Fetal screens for spina bifida and Down syndrome. °· You may need other tests to make sure you and the baby are doing well. °HOME CARE INSTRUCTIONS  °Medicines °· Follow your health care provider's instructions regarding medicine use. Specific medicines may be either safe or unsafe to take during pregnancy. °· Take your prenatal vitamins as directed. °· If you develop constipation, try taking a stool softener if your health care provider approves. °Diet °· Eat regular, well-balanced meals. Choose a variety of foods, such as meat or vegetable-based protein, fish, milk and low-fat dairy products, vegetables, fruits, and whole grain breads and cereals. Your health care provider will help you determine the amount of weight gain that is right for you. °· Avoid raw meat and uncooked cheese. These   carry germs that can cause birth defects in the baby. °· Eating four or five small meals rather than three large meals a day may help relieve nausea and vomiting. If you start to feel nauseous, eating a few soda crackers can be helpful. Drinking liquids between meals instead of during meals also seems to help nausea and vomiting. °· If you develop constipation, eat more high-fiber foods, such as fresh vegetables or fruit and whole grains. Drink enough fluids to keep your urine clear or pale yellow. °Activity and Exercise °· Exercise only as directed by your health care provider. Exercising will help you: °¨ Control your weight. °¨ Stay in shape. °¨ Be prepared for labor and delivery. °· Experiencing pain or cramping in the lower abdomen or low back is a good sign that you should stop exercising. Check with your health care  provider before continuing normal exercises. °· Try to avoid standing for long periods of time. Move your legs often if you must stand in one place for a long time. °· Avoid heavy lifting. °· Wear low-heeled shoes, and practice good posture. °· You may continue to have sex unless your health care provider directs you otherwise. °Relief of Pain or Discomfort °· Wear a good support bra for breast tenderness.   °· Take warm sitz baths to soothe any pain or discomfort caused by hemorrhoids. Use hemorrhoid cream if your health care provider approves.   °· Rest with your legs elevated if you have leg cramps or low back pain. °· If you develop varicose veins in your legs, wear support hose. Elevate your feet for 15 minutes, 3-4 times a day. Limit salt in your diet. °Prenatal Care °· Schedule your prenatal visits by the twelfth week of pregnancy. They are usually scheduled monthly at first, then more often in the last 2 months before delivery. °· Write down your questions. Take them to your prenatal visits. °· Keep all your prenatal visits as directed by your health care provider. °Safety °· Wear your seat belt at all times when driving. °· Make a list of emergency phone numbers, including numbers for family, friends, the hospital, and police and fire departments. °General Tips °· Ask your health care provider for a referral to a local prenatal education class. Begin classes no later than at the beginning of month 6 of your pregnancy. °· Ask for help if you have counseling or nutritional needs during pregnancy. Your health care provider can offer advice or refer you to specialists for help with various needs. °· Do not use hot tubs, steam rooms, or saunas. °· Do not douche or use tampons or scented sanitary pads. °· Do not cross your legs for long periods of time. °· Avoid cat litter boxes and soil used by cats. These carry germs that can cause birth defects in the baby and possibly loss of the fetus by miscarriage or  stillbirth. °· Avoid all smoking, herbs, alcohol, and medicines not prescribed by your health care provider. Chemicals in these affect the formation and growth of the baby. °· Schedule a dentist appointment. At home, brush your teeth with a soft toothbrush and be gentle when you floss. °SEEK MEDICAL CARE IF:  °· You have dizziness. °· You have mild pelvic cramps, pelvic pressure, or nagging pain in the abdominal area. °· You have persistent nausea, vomiting, or diarrhea. °· You have a bad smelling vaginal discharge. °· You have pain with urination. °· You notice increased swelling in your face, hands, legs, or ankles. °SEEK   IMMEDIATE MEDICAL CARE IF:  °· You have a fever. °· You are leaking fluid from your vagina. °· You have spotting or bleeding from your vagina. °· You have severe abdominal cramping or pain. °· You have rapid weight gain or loss. °· You vomit blood or material that looks like coffee grounds. °· You are exposed to German measles and have never had them. °· You are exposed to fifth disease or chickenpox. °· You develop a severe headache. °· You have shortness of breath. °· You have any kind of trauma, such as from a fall or a car accident. °Document Released: 02/01/2001 Document Revised: 06/24/2013 Document Reviewed: 12/18/2012 °ExitCare® Patient Information ©2015 [employer]. This information is not intended to replace advice given to you by your health care provider. Make sure you discuss any questions you have with your health care provider. ° °

## 2014-09-19 NOTE — ED Notes (Signed)
Patient transported to Ultrasound 

## 2014-09-19 NOTE — ED Provider Notes (Signed)
CSN: [id number]     Arrival date & time 09/19/14  1331 History   First MD Initiated Contact with Patient 09/19/14 1521     Chief Complaint  Patient presents with  . Abdominal Pain  . Possible Pregnancy   Erica Harrell is a 19 y.o. female who is G1P1001 who presents to the ED complaining of intermittent left sided abdominal pain since yesterday. She also thinks she may be pregnant as she has missed her last menstrual cycle that was last 08/12/14. The patient denies current abdominal pain, she reports her abdominal pain has been intermittent. Patient reports she is sexually active and not using protection. She reports symptoms of increased acid reflux yesterday. The patient denies fevers, chills, nausea, vomiting, dizziness, lightheadedness, chest pain, shortness of breath, urinary symptoms, hematuria, vaginal bleeding, or vaginal discharge. The patient declines HIV or syphilis testing.  (Consider location/radiation/quality/duration/timing/severity/associated sxs/prior Treatment) HPI  Past Medical History  Diagnosis Date  . Constipation   . Asthma   . Bacterial vaginosis   . Seasonal allergies   . Headache(784.0)   . Pelvic inflammatory disease    Past Surgical History  Procedure Laterality Date  . Vaginal delivery     No family history on file. History  Substance Use Topics  . Smoking status: Never Smoker   . Smokeless tobacco: Not on file  . Alcohol Use: No   OB History    Gravida Para Term Preterm AB TAB SAB Ectopic Multiple Living   1 1 1       1      Review of Systems  Constitutional: Negative for fever and chills.  HENT: Negative for congestion and sore throat.   Eyes: Negative for visual disturbance.  Respiratory: Negative for cough, shortness of breath and wheezing.   Cardiovascular: Negative for chest pain and palpitations.  Gastrointestinal: Positive for abdominal pain. Negative for nausea, vomiting, diarrhea and blood in stool.  Genitourinary: Negative for  dysuria, urgency, frequency, hematuria, vaginal bleeding, vaginal discharge, difficulty urinating and pelvic pain.  Musculoskeletal: Negative for back pain and neck pain.  Skin: Negative for rash.  Neurological: Negative for dizziness, syncope, weakness, light-headedness and headaches.      Allergies  Shellfish allergy and Zoloft  Home Medications   Prior to Admission medications   Medication Sig Start Date End Date Taking? Authorizing Provider  cyclobenzaprine (FLEXERIL) 10 MG tablet Take 1 tablet (10 mg total) by mouth 3 (three) times daily as needed for muscle spasms (or pain). Patient not taking: Reported on 09/19/2014 07/21/14   Trixie Dredge, PA-C  ibuprofen (ADVIL,MOTRIN) 800 MG tablet Take 1 tablet (800 mg total) by mouth every 8 (eight) hours as needed for mild pain or moderate pain. Patient not taking: Reported on 09/19/2014 07/21/14   Trixie Dredge, PA-C  magnesium citrate SOLN Take 296 mLs (1 Bottle total) by mouth once. Patient not taking: Reported on 05/11/2014 01/20/14   Tatyana Kirichenko, PA-C  sodium phosphate (FLEET) 7-19 GM/118ML ENEM Place 133 mLs (1 enema total) rectally daily as needed for severe constipation. Patient not taking: Reported on 05/11/2014 01/20/14   Tatyana Kirichenko, PA-C   BP 107/65 mmHg  Pulse 75  Temp(Src) 98.5 F (36.9 C) (Oral)  Resp 18  Ht 5\' 4"  (1.626 m)  Wt 150 lb (68.04 kg)  BMI 25.73 kg/m2  SpO2 100%  LMP 08/12/2014 (Exact Date) Physical Exam  Constitutional: She is oriented to person, place, and time. She appears well-developed and well-nourished. No distress.  Nontoxic appearing.  HENT:  Head:  Normocephalic and atraumatic.  Mouth/Throat: Oropharynx is clear and moist. No oropharyngeal exudate.  Eyes: Conjunctivae are normal. Pupils are equal, round, and reactive to light. Right eye exhibits no discharge. Left eye exhibits no discharge.  Neck: Neck supple.  Cardiovascular: Normal rate, regular rhythm, normal heart sounds and intact  distal pulses.  Exam reveals no gallop and no friction rub.   No murmur heard. Pulmonary/Chest: Effort normal and breath sounds normal. No respiratory distress. She has no wheezes. She has no rales.  Abdominal: Soft. Bowel sounds are normal. She exhibits no distension and no mass. There is no tenderness. There is no rebound and no guarding.  Abdomen is soft and nontender to palpation. No McBurney's point tenderness. Negative psoas and obturator sign. No CVA tenderness.  Genitourinary:  Pelvic exam performed by me with female nurse tech chaperone. No external lesions noted. Mild amount of white vaginal discharge noted. Cervix is closed. No cervical motion tenderness. No adnexal tenderness or fullness.  Musculoskeletal: She exhibits no edema or tenderness.  No lower extremity edema or tenderness.  Lymphadenopathy:    She has no cervical adenopathy.  Neurological: She is alert and oriented to person, place, and time. Coordination normal.  Skin: Skin is warm and dry. No rash noted. She is not diaphoretic. No erythema. No pallor.  Psychiatric: She has a normal mood and affect. Her behavior is normal.  Nursing note and vitals reviewed.   ED Course  Procedures (including critical care time) Labs Review Labs Reviewed  WET PREP, GENITAL - Abnormal; Notable for the following:    Trich, Wet Prep FEW (*)    Clue Cells Wet Prep HPF POC FEW (*)    WBC, Wet Prep HPF POC FEW (*)    All other components within normal limits  COMPREHENSIVE METABOLIC PANEL - Abnormal; Notable for the following:    Glucose, Bld 104 (*)    Albumin 3.2 (*)    All other components within normal limits  URINALYSIS, ROUTINE W REFLEX MICROSCOPIC (NOT AT [facility]) - Abnormal; Notable for the following:    APPearance HAZY (*)    Leukocytes, UA MODERATE (*)    All other components within normal limits  HCG, QUANTITATIVE, PREGNANCY - Abnormal; Notable for the following:    hCG, Beta Chain, Quant, S 673 (*)    All other components  within normal limits  URINE MICROSCOPIC-ADD ON - Abnormal; Notable for the following:    Squamous Epithelial / LPF MANY (*)    Bacteria, UA FEW (*)    All other components within normal limits  POC URINE PREG, ED - Abnormal; Notable for the following:    Preg Test, Ur POSITIVE (*)    All other components within normal limits  URINE CULTURE  LIPASE, BLOOD  CBC  GC/CHLAMYDIA PROBE AMP ([place]) NOT AT [facility]    Imaging Review US Ob Comp Less 14 Wks  09/19/2014   CLINICAL DATA:  LEFT lower quadrant pain in LEFT upper quadrant pain. Positive urine pregnancy test.  EXAM: OBSTETRIC <14 WK [place] AND TRANSVAGINAL OB US  TECHNIQUE: Both transabdominal and transvaginal ultrasound examinations were performed for complete evaluation of the gestation as well as the maternal uterus, adnexal regions, and pelvic cul-de-sac. Transvaginal technique was performed to assess early pregnancy.  COMPARISON:  None.  FINDINGS: Intrauterine gestational sac: Not identified  Yolk sac:  Not identified  Embryo:  Not identified  Maternal uterus/adnexae: Ovaries are normal with small follicles. RIGHT ovary measures 4.2 x 2.3 x 2.4 cm.  The LEFT ovary measures 2.9 x 1.3 x 2.0 cm. Uterus and endometrium appear normal. No free fluid.  IMPRESSION: No intrauterine gestation. Differential includes early intrauterine gestation, spontaneous abortion in progress or occult ectopic pregnancy.   Electronically Signed   By: Genevive Bi M.D.   On: 09/19/2014 18:53   US Ob Transvaginal  09/19/2014   CLINICAL DATA:  LEFT lower quadrant pain in LEFT upper quadrant pain. Positive urine pregnancy test.  EXAM: OBSTETRIC <14 WK [place] AND TRANSVAGINAL OB US  TECHNIQUE: Both transabdominal and transvaginal ultrasound examinations were performed for complete evaluation of the gestation as well as the maternal uterus, adnexal regions, and pelvic cul-de-sac. Transvaginal technique was performed to assess early pregnancy.  COMPARISON:  None.  FINDINGS:  Intrauterine gestational sac: Not identified  Yolk sac:  Not identified  Embryo:  Not identified  Maternal uterus/adnexae: Ovaries are normal with small follicles. RIGHT ovary measures 4.2 x 2.3 x 2.4 cm. The LEFT ovary measures 2.9 x 1.3 x 2.0 cm. Uterus and endometrium appear normal. No free fluid.  IMPRESSION: No intrauterine gestation. Differential includes early intrauterine gestation, spontaneous abortion in progress or occult ectopic pregnancy.   Electronically Signed   By: Genevive Bi M.D.   On: 09/19/2014 18:53     EKG Interpretation None      Filed Vitals:   09/19/14 1730 09/19/14 1918 09/19/14 1919 09/19/14 1930  BP: 107/58 105/60  107/65  Pulse: 75  77 75  Temp:      TempSrc:      Resp: 18     Height:      Weight:      SpO2: 100%  100% 100%     MDM   Meds given in ED:  Medications  cefTRIAXone (ROCEPHIN) injection 250 mg (not administered)  azithromycin (ZITHROMAX) tablet 1,000 mg (not administered)    New Prescriptions   No medications on file    Final diagnoses:  Pregnant  Intermittent abdominal pain   This  is a 19 y.o. female who is G1P1001 who presents to the ED complaining of intermittent left sided abdominal pain since yesterday. She also thinks she may be pregnant as she has missed her last menstrual cycle that was last 08/12/14. The patient denies current abdominal pain, she reports her abdominal pain has been intermittent. On exam the patient is afebrile nontoxic appearing. Her abdomen is soft and nontender to palpation. Pelvic exam is unremarkable. Patient's quantitative pregnancy is 673. CBC is within normal limits. CMP is unremarkable. Urine returned with moderate leukocytes and many squamous epithelial and few bacteria. Urine was sent for culture. Wet prep returned with few clue cells few trichomoniasis and few white blood cells. Plan to treat with Rocephin and azithromycin. Patient's gonorrhea chlamydia are pending. Patient refused HIV and syphilis  testing. OB ultrasound indicated no intrauterine gestation and no occult ectopic pregnancy. I educated the patient on these findings and discussed with her that she needs to follow-up with women's outpatient next week for repeat ultrasound to ensure that she does not have an ectopic pregnancy. She agreed with this plan and verbalized understanding. She denied abdominal pain.  Patient then eloped the ED prior to receiving azithromycin and Rocephin as well as a prescription for Flagyl. I called the patient's mother and requested that she return back to the emergency department. I also called the patient's cell phone multiple times with no answer and no voicemail.       Everlene Farrier, PA-C 09/19/14 2022  Blake Divine,  MD 09/19/14 2059

## 2014-09-19 NOTE — Discharge Instructions (Signed)
Abdominal Pain During Pregnancy Abdominal pain is common in pregnancy. Most of the time, it does not cause harm. There are many causes of abdominal pain. Some causes are more serious than others. Some of the causes of abdominal pain in pregnancy are easily diagnosed. Occasionally, the diagnosis takes time to understand. Other times, the cause is not determined. Abdominal pain can be a sign that something is very wrong with the pregnancy, or the pain may have nothing to do with the pregnancy at all. For this reason, always tell your health care provider if you have any abdominal discomfort. HOME CARE INSTRUCTIONS  Monitor your abdominal pain for any changes. The following actions may help to alleviate any discomfort you are experiencing:  Do not have sexual intercourse or put anything in your vagina until your symptoms go away completely.  Get plenty of rest until your pain improves.  Drink clear fluids if you feel nauseous. Avoid solid food as long as you are uncomfortable or nauseous.  Only take over-the-counter or prescription medicine as directed by your health care provider.  Keep all follow-up appointments with your health care provider. SEEK IMMEDIATE MEDICAL CARE IF:  You are bleeding, leaking fluid, or passing tissue from the vagina.  You have increasing pain or cramping.  You have persistent vomiting.  You have painful or bloody urination.  You have a fever.  You notice a decrease in your baby's movements.  You have extreme weakness or feel faint.  You have shortness of breath, with or without abdominal pain.  You develop a severe headache with abdominal pain.  You have abnormal vaginal discharge with abdominal pain.  You have persistent diarrhea.  You have abdominal pain that continues even after rest, or gets worse. MAKE SURE YOU:   Understand these instructions.  Will watch your condition.  Will get help right away if you are not doing well or get  worse. Document Released: 02/07/2005 Document Revised: 11/28/2012 Document Reviewed: 09/06/2012 [facility] Patient Information 2015 [place], [place]. This information is not intended to replace advice given to you by your health care provider. Make sure you discuss any questions you have with your health care provider. Sexually Transmitted Disease A sexually transmitted disease (STD) is a disease or infection that may be passed (transmitted) from person to person, usually during sexual activity. This may happen by way of saliva, semen, blood, vaginal mucus, or urine. Common STDs include:   Gonorrhea.   Chlamydia.   Syphilis.   HIV and AIDS.   Genital herpes.   Hepatitis B and C.   Trichomonas.   Human papillomavirus (HPV).   Pubic lice.   Scabies.  Mites.  Bacterial vaginosis. WHAT ARE CAUSES OF STDs? An STD may be caused by bacteria, a virus, or parasites. STDs are often transmitted during sexual activity if one person is infected. However, they may also be transmitted through nonsexual means. STDs may be transmitted after:   Sexual intercourse with an infected person.   Sharing sex toys with an infected person.   Sharing needles with an infected person or using unclean piercing or tattoo needles.  Having intimate contact with the genitals, mouth, or rectal areas of an infected person.   Exposure to infected fluids during birth. WHAT ARE THE SIGNS AND SYMPTOMS OF STDs? Different STDs have different symptoms. Some people may not have any symptoms. If symptoms are present, they may include:   Painful or bloody urination.   Pain in the pelvis, abdomen, vagina, anus, throat, or eyes.  A skin rash, itching, or irritation.  Growths, ulcerations, blisters, or sores in the genital and anal areas.  Abnormal vaginal discharge with or without bad odor.   Penile discharge in men.   Fever.   Pain or bleeding during sexual intercourse.   Swollen glands  in the groin area.   Yellow skin and eyes (jaundice). This is seen with hepatitis.   Swollen testicles.  Infertility.  Sores and blisters in the mouth. HOW ARE STDs DIAGNOSED? To make a diagnosis, your health care provider may:   Take a medical history.   Perform a physical exam.   Take a sample of any discharge to examine.  Swab the throat, cervix, opening to the penis, rectum, or vagina for testing.  Test a sample of your first morning urine.   Perform blood tests.   Perform a Pap test, if this applies.   Perform a colposcopy.   Perform a laparoscopy.  HOW ARE STDs TREATED? Treatment depends on the STD. Some STDs may be treated but not cured.   Chlamydia, gonorrhea, trichomonas, and syphilis can be cured with antibiotic medicine.   Genital herpes, hepatitis, and HIV can be treated, but not cured, with prescribed medicines. The medicines lessen symptoms.   Genital warts from HPV can be treated with medicine or by freezing, burning (electrocautery), or surgery. Warts may come back.   HPV cannot be cured with medicine or surgery. However, abnormal areas may be removed from the cervix, vagina, or vulva.   If your diagnosis is confirmed, your recent sexual partners need treatment. This is true even if they are symptom-free or have a negative culture or evaluation. They should not have sex until their health care providers say it is okay. HOW CAN I REDUCE MY RISK OF GETTING AN STD? Take these steps to reduce your risk of getting an STD:  Use latex condoms, dental dams, and water-soluble lubricants during sexual activity. Do not use petroleum jelly or oils.  Avoid having multiple sex partners.  Do not have sex with someone who has other sex partners.  Do not have sex with anyone you do not know or who is at high risk for an STD.  Avoid risky sex practices that can break your skin.  Do not have sex if you have open sores on your mouth or skin.  Avoid  drinking too much alcohol or taking illegal drugs. Alcohol and drugs can affect your judgment and put you in a vulnerable position.  Avoid engaging in oral and anal sex acts.  Get vaccinated for HPV and hepatitis. If you have not received these vaccines in the past, talk to your health care provider about whether one or both might be right for you.   If you are at risk of being infected with HIV, it is recommended that you take a prescription medicine daily to prevent HIV infection. This is called pre-exposure prophylaxis (PrEP). You are considered at risk if:  You are a man who has sex with other men (MSM).  You are a heterosexual man or woman and are sexually active with more than one partner.  You take drugs by injection.  You are sexually active with a partner who has HIV.  Talk with your health care provider about whether you are at high risk of being infected with HIV. If you choose to begin PrEP, you should first be tested for HIV. You should then be tested every 3 months for as long as you are taking PrEP.  WHAT SHOULD I DO IF I THINK I HAVE AN STD?  See your health care provider.   Tell your sexual partner(s). They should be tested and treated for any STDs.  Do not have sex until your health care provider says it is okay. WHEN SHOULD I GET IMMEDIATE MEDICAL CARE? Contact your health care provider right away if:   You have severe abdominal pain.  You are a man and notice swelling or pain in your testicles.  You are a woman and notice swelling or pain in your vagina. Document Released: 04/30/2002 Document Revised: 02/12/2013 Document Reviewed: 08/28/2012 [facility] Patient Information 2015 [place]. This information is not intended to replace advice given to you by your health care provider. Make sure you discuss any questions you have with your health care provider.

## 2014-09-19 NOTE — ED Notes (Signed)
RN saw pt and significant other leaving through front entrance of ED; PA aware and has contacted family to ask if pt can return for tx

## 2014-09-19 NOTE — ED Notes (Signed)
Pt reports being seen earlier tonight in same ED for pelvic pain and possible pregnancy.  Pt reports having to leave before being discharged to get daughter. Pt is back now for medications. NAD noted at this time,. resp e/u.

## 2014-09-19 NOTE — ED Notes (Signed)
Onset 3 days ago intermittent LLQ and LUQ abdominal pain without nausea, emesis, or diarrhea. Concerned LMP June 21st  ended June 26th possible pregnant.

## 2014-09-21 LAB — URINE CULTURE

## 2014-09-22 LAB — GC/CHLAMYDIA PROBE AMP (~~LOC~~) NOT AT ARMC
CHLAMYDIA, DNA PROBE: NEGATIVE
Neisseria Gonorrhea: NEGATIVE

## 2014-10-10 ENCOUNTER — Emergency Department (HOSPITAL_COMMUNITY)
Admission: EM | Admit: 2014-10-10 | Discharge: 2014-10-10 | Disposition: A | Discharge status: Home / Self Care | Payer: Medicaid Other | Attending: Emergency Medicine | Admitting: Emergency Medicine

## 2014-10-10 DIAGNOSIS — S39012A Strain of muscle, fascia and tendon of lower back, initial encounter: Secondary | ICD-10-CM | POA: Diagnosis not present

## 2014-10-10 DIAGNOSIS — J392 Other diseases of pharynx: Secondary | ICD-10-CM | POA: Insufficient documentation

## 2014-10-10 DIAGNOSIS — Z8719 Personal history of other diseases of the digestive system: Secondary | ICD-10-CM | POA: Insufficient documentation

## 2014-10-10 DIAGNOSIS — J45909 Unspecified asthma, uncomplicated: Secondary | ICD-10-CM | POA: Diagnosis not present

## 2014-10-10 DIAGNOSIS — O99511 Diseases of the respiratory system complicating pregnancy, first trimester: Secondary | ICD-10-CM | POA: Diagnosis not present

## 2014-10-10 DIAGNOSIS — Y990 Civilian activity done for income or pay: Secondary | ICD-10-CM | POA: Diagnosis not present

## 2014-10-10 DIAGNOSIS — Z792 Long term (current) use of antibiotics: Secondary | ICD-10-CM | POA: Insufficient documentation

## 2014-10-10 DIAGNOSIS — Z8742 Personal history of other diseases of the female genital tract: Secondary | ICD-10-CM | POA: Insufficient documentation

## 2014-10-10 DIAGNOSIS — O9A211 Injury, poisoning and certain other consequences of external causes complicating pregnancy, first trimester: Secondary | ICD-10-CM | POA: Insufficient documentation

## 2014-10-10 DIAGNOSIS — Y9289 Other specified places as the place of occurrence of the external cause: Secondary | ICD-10-CM | POA: Insufficient documentation

## 2014-10-10 DIAGNOSIS — Z3A01 Less than 8 weeks gestation of pregnancy: Secondary | ICD-10-CM | POA: Insufficient documentation

## 2014-10-10 DIAGNOSIS — Z3490 Encounter for supervision of normal pregnancy, unspecified, unspecified trimester: Secondary | ICD-10-CM

## 2014-10-10 DIAGNOSIS — Y9389 Activity, other specified: Secondary | ICD-10-CM | POA: Insufficient documentation

## 2014-10-10 DIAGNOSIS — X58XXXA Exposure to other specified factors, initial encounter: Secondary | ICD-10-CM | POA: Insufficient documentation

## 2014-10-10 DIAGNOSIS — Z8619 Personal history of other infectious and parasitic diseases: Secondary | ICD-10-CM | POA: Diagnosis not present

## 2014-10-10 MED ORDER — ALUM & MAG HYDROXIDE-SIMETH 200-200-20 MG/5ML PO SUSP
30.0000 mL | Freq: Once | ORAL | Status: AC
  Administered 2014-10-10: 30 mL via ORAL
  Filled 2014-10-10: qty 30

## 2014-10-10 MED ORDER — ACETAMINOPHEN 325 MG PO TABS
650.0000 mg | ORAL_TABLET | Freq: Once | ORAL | Status: AC
Start: 2014-10-10 — End: 2014-10-10
  Administered 2014-10-10: 650 mg via ORAL
  Filled 2014-10-10: qty 2

## 2014-10-10 NOTE — ED Notes (Signed)
She c/o lower back pain after lifting boxes at work yesterday. She also c/o burning in her throat after eating sausage for lunch today, thinks its acid reflux. She denies any vaginal discharge/bleeding.

## 2014-10-10 NOTE — ED Provider Notes (Signed)
CSN: [id number]     Arrival date & time 10/10/14  1325 History  This chart was scribed for Wynetta Emery, PA-C, working with Blane Ohara, MD by Elon Spanner, ED Scribe. This patient was seen in room TR02C/TR02C and the patient's care was started at 2:03 PM.   Chief Complaint  Patient presents with  . Back Pain  . Gastrophageal Reflux   Patient is a 19 y.o. female presenting with GERD. The history is provided by the patient. No language interpreter was used.  Gastrophageal Reflux   HPI Comments: Erica Harrell is a 19 y.o. female who presents to the Emergency Department complaining of constant, unchanged, moderate, nonradiating lower back pain onset this morning after lifting boxes at work (~25-50 lbs).  No medications have been tried  Patient is currently two months pregnant and followed by the [facility].  Denies dysuria, hematuria, abnormal vaginal discharge, abdominal pain or spotting.  Patient also reports some minor, currently resolved throat irritation after eating a sausage earlier this morning.  She denies difficulty swallowing, continued symptoms.     Past Medical History  Diagnosis Date  . Constipation   . Asthma   . Bacterial vaginosis   . Seasonal allergies   . Headache(784.0)   . Pelvic inflammatory disease    Past Surgical History  Procedure Laterality Date  . Vaginal delivery     No family history on file. Social History  Substance Use Topics  . Smoking status: Never Smoker   . Smokeless tobacco: Not on file  . Alcohol Use: No   OB History    Gravida Para Term Preterm AB TAB SAB Ectopic Multiple Living   1 1 1       1      Review of Systems  Musculoskeletal: Positive for back pain.  All other systems reviewed and are negative.    Allergies  Shellfish allergy and Zoloft  Home Medications   Prior to Admission medications   Medication Sig Start Date End Date Taking? Authorizing Provider  cyclobenzaprine (FLEXERIL) 10 MG  tablet Take 1 tablet (10 mg total) by mouth 3 (three) times daily as needed for muscle spasms (or pain). Patient not taking: Reported on 09/19/2014 07/21/14   Trixie Dredge, PA-C  ibuprofen (ADVIL,MOTRIN) 800 MG tablet Take 1 tablet (800 mg total) by mouth every 8 (eight) hours as needed for mild pain or moderate pain. Patient not taking: Reported on 09/19/2014 07/21/14   Trixie Dredge, PA-C  magnesium citrate SOLN Take 296 mLs (1 Bottle total) by mouth once. Patient not taking: Reported on 05/11/2014 01/20/14   Tatyana Kirichenko, PA-C  metroNIDAZOLE (FLAGYL) 500 MG tablet Take 1 tablet (500 mg total) by mouth 2 (two) times daily. 09/19/14   Everlene Farrier, PA-C  sodium phosphate (FLEET) 7-19 GM/118ML ENEM Place 133 mLs (1 enema total) rectally daily as needed for severe constipation. Patient not taking: Reported on 05/11/2014 01/20/14   Tatyana Kirichenko, PA-C   BP 126/73 mmHg  Pulse 76  Temp(Src) 99 F (37.2 C) (Oral)  Resp 18  Ht 5\' 3"  (1.6 m)  Wt 163 lb (73.936 kg)  BMI 28.88 kg/m2  SpO2 100%  LMP 08/12/2014 (Exact Date) Physical Exam  Constitutional: She is oriented to person, place, and time. She appears well-developed and well-nourished. No distress.  HENT:  Head: Normocephalic and atraumatic.  Mouth/Throat: Oropharynx is clear and moist.  Eyes: Conjunctivae and EOM are normal. Pupils are equal, round, and reactive to light.  Neck: Normal range of motion. Neck  supple. No tracheal deviation present.  Cardiovascular: Normal rate, regular rhythm and intact distal pulses.   Pulmonary/Chest: Effort normal and breath sounds normal. No respiratory distress.  Abdominal: Soft. There is no tenderness.  Musculoskeletal: Normal range of motion. She exhibits tenderness.  Neurological: She is alert and oriented to person, place, and time.  No point tenderness to percussion of lumbar spinal processes.  No TTP or paraspinal muscular spasm. Strength is 5 out of 5 to bilateral lower extremities at hip and  knee; extensor hallucis longus 5 out of 5. Ankle strength 5 out of 5, no clonus, neurovascularly intact. No saddle anaesthesia. Patellar reflexes are 2+ bilaterally.      Skin: Skin is warm and dry. She is not diaphoretic.  Psychiatric: She has a normal mood and affect. Her behavior is normal.  Nursing note and vitals reviewed.   ED Course  Procedures (including critical care time)  DIAGNOSTIC STUDIES: Oxygen Saturation is 100% on RA, normla by my interpretation.    COORDINATION OF CARE:  2:08 PM Discussed treatment plan with patient at bedside.  Patient acknowledges and agrees with plan.    Labs Review Labs Reviewed - No data to display  Imaging Review No results found. I have personally reviewed and evaluated these images and lab results as part of my medical decision-making.   EKG Interpretation None      MDM   Final diagnoses:  Lumbar strain, initial encounter  Throat irritation  Pregnancy    Filed Vitals:   10/10/14 1343  BP: 126/73  Pulse: 76  Temp: 99 F (37.2 C)  TempSrc: Oral  Resp: 18  Height:  (1.6 m)  Weight: 163 lb (73.936 kg)  SpO2: 100%    Medications  alum & mag hydroxide-simeth (MAALOX/MYLANTA) 200-200-20 MG/5ML suspension 30 mL (30 mLs Oral Given 10/10/14 1429)  acetaminophen (TYLENOL) tablet 650 mg (650 mg Oral Given 10/10/14 1429)    Erica Harrell is a pleasant 19 y.o. female presenting with low back pain after lifting boxes while at work. Patient also states that she ate some sausage that irritated the back of her throat. Neuro exam is nonfocal, patient is pregnant and [facility]. She has no abdominal or vaginal complaints today. Patient is given a work note and have advised her that the only safe pain medication and pregnancies acetaminophen. Advised her on return precautions and to follow closely with OB/GYN and on safe lifting habits.   Evaluation does not show pathology that would require  ongoing emergent intervention or inpatient treatment. Pt is hemodynamically stable and mentating appropriately. Discussed findings and plan with patient/guardian, who agrees with care plan. All questions answered. Return precautions discussed and outpatient follow up given.    I personally performed the services described in this documentation, which was scribed in my presence. The recorded information has been reviewed and is accurate.    Wynetta Emery, PA-C 10/10/14 1459  Blane Ohara, MD 10/11/14 [phone]

## 2014-10-10 NOTE — Discharge Instructions (Signed)
Take acetaminophen (Tylenol) up to 975 mg (this is normally 3 over-the-counter pills) up to 3 times a day. Do not drink alcohol. Make sure your other medications do not contain acetaminophen (Read the labels!)  Do not hesitate to return to the emergency room for any new, worsening or concerning symptoms.  Please obtain primary care using resource guide below. Let them know that you were seen in the emergency room and that they will need to obtain records for further outpatient management.    Emergency Department Resource Guide 1) Find a Doctor and Pay Out of Pocket Although you won't have to find out who is covered by your insurance plan, it is a good idea to ask around and get recommendations. You will then need to call the office and see if the doctor you have chosen will accept you as a new patient and what types of options they offer for patients who are self-pay. Some doctors offer discounts or will set up payment plans for their patients who do not have insurance, but you will need to ask so you aren't surprised when you get to your appointment.  2) Contact Your Local Health Department Not all health departments have doctors that can see patients for sick visits, but many do, so it is worth a call to see if yours does. If you don't know where your local health department is, you can check in your phone book. The CDC also has a tool to help you locate your state's health department, and many state websites also have listings of all of their local health departments.  3) Find a Walk-in Clinic If your illness is not likely to be very severe or complicated, you may want to try a walk in clinic. These are popping up all over the country in pharmacies, drugstores, and shopping centers. They're usually staffed by nurse practitioners or physician assistants that have been trained to treat common illnesses and complaints. They're usually fairly quick and inexpensive. However, if you have serious medical  issues or chronic medical problems, these are probably not your best option.  No Primary Care Doctor: - Call Health Connect at  [phone] - they can help you locate a primary care doctor that  accepts your insurance, provides certain services, etc. - Physician Referral Service- [phone]  Chronic Pain Problems: Organization         Address  Phone   Notes  [facility]  [phone] Patients need to be referred by their primary care doctor.   Medication Assistance: Organization         Address  Phone   Notes  [facility] Medication [facility] [address] [place] [place], [place] [address] [phone] --Must be a resident of [facility] -- Must have NO insurance coverage whatsoever (no Medicaid/ Medicare, etc.) -- The pt. MUST have a primary care doctor that directs their care regularly and follows them in the community   [facility]ist  [phone]   [employer]  [phone]    Agencies that provide inexpensive medical care: Organization         Address  Phone   Notes  [facility]  [phone]   [facility]    [phone]   [facility] [address] [place], [place] [address] [phone]   [facility] [address] [place], [place] [phone]   [facility]anned Parenthood    [phone]  [facility]    [phone]   [facility]  [address], [place] Phone:  [phone], Fax:  [phone] Hours of Operation:  9 am - 6 pm, M-F.  Also accepts Medicaid/Medicare and self-pay.  [facility] for [place] [address], [address], [place] Phone: [phone], Fax: [phone]. Hours of Operation:  8:30 am - 5:30 pm, M-F.  Also accepts Medicaid and self-pay.  [facility] [place] [address], [place] Phone: [phone]   [place], [place], [place]  [phone], Ext. [phone] Mondays & Thursdays: 7-9 AM.  First 15 patients are seen on a first come, first serve basis.    [place] Providers:  Organization         Address  Phone   Notes  [facility] [address], [place] [phone] Also accepts self-pay patients.  [facility] [address], [place]  [phone]   [place], [address], [place] [phone]   [facility] [address] [place] [phone]   Lucianne Lei [address], [place]   [phone] Only accepts [place] Access [place] patients after they have their name applied to their card.   Self-Pay (no insurance) in [facility]:  Organization         Address  Phone   Notes  Sickle Cell Patients, [facility] [place] [phone]   [facility] [place] [phone]   [facility] [place]  [place], [place], [place] [phone]   [facility]/Dr. Osei-Bonsu  [address] [place] or [address], [place] [phone] Phone number for both [place] and [place] locations is the same.  [facility] [address] [place] [phone]   [facility] [address] [place] or [address] [phone]   [facility] [address], [place] [phone], phone; [phone], fax Sees patients 1st and 3rd Saturday of every month.  Must not qualify for public or private insurance (i.e. Medicaid, Medicare, [place] Health Choice, Veterans' Benefits)  Household income should be no more than 200% of the poverty level The clinic cannot treat you if you are pregnant or think you are pregnant  Sexually transmitted  diseases are not treated at the clinic.    Dental Care: Organization         Address  Phone  Notes  [facility] Department [facility] [place] [phone] Accepts children up to age 15 who are enrolled in [place] or [place]; pregnant women with a Medicaid card; and children who have applied for Medicaid or [place] Health Choice, but were declined, whose parents can pay a reduced fee at time of service.  [facility]  [address], [place] [phone] Accepts children up to age 40 who are enrolled in [place] or [place]; pregnant women with a Medicaid card; and children who have applied for Medicaid or [place] Health Choice, but were declined, whose parents can pay a reduced fee at time  of service.  °[facility] ° [address], [place] [phone] Patients are seen by appointment only. Walk-ins are not accepted. [facility] will see patients 18 years of age and older. °Monday - Tuesday (8am-5pm) °Most Wednesdays (8:30-5pm) °$30 per visit, cash only  °[facility] ° [address], [place] [phone] Patients are seen by appointment only. Walk-ins are not accepted. [facility] will see patients 18 years of age and older. °One Wednesday Evening (Monthly: Volunteer Based).  $30 per visit, cash only  °[facility]  [phone] for adults; Children under age 4, call Graduate Pediatric Dentistry at [phone]. Children aged 4-14, please call [phone] to request a pediatric application. ° Dental services are provided in all areas of dental care including fillings, crowns and bridges, complete and partial dentures, implants, gum treatment, root canals, and extractions. Preventive care is also provided. Treatment is provided to both adults and children. °Patients are selected via a  lottery and there is often a waiting list. °  °[facility] [address], °[place] ° ([phone] [email or web address] °  °[facility] [address], [place], [place] ([phone], Ext. [phone] Second and Fourth Thursday of each month, opens at 6:30 AM; Clinic ends at 9 AM.  Patients are seen on a first-come first-served basis, and a limited number are seen during each clinic.  ° °[facility] ° [address], [place], [place] [phone]   Eligibility Requirements °You must have lived in [place], [place], or [place] counties for at least the last three months. °  You cannot be eligible for state or federal sponsored healthcare insurance, including Veterans Administration, Medicaid, or Medicare. °  You generally cannot be eligible for healthcare insurance through your employer.  °  How to apply: °Eligibility screenings are held every Tuesday and Wednesday afternoon from 1:00 pm until 4:00 pm. You do not need an appointment for the interview!  °[facility] [address], [place], [place] [phone]   °[facility]  [phone]   °[facility]  [phone]   °[facility]  336-570-6415   ° °Behavioral Health Resources in the Community: °Intensive Outpatient Programs °Organization         Address  Phone  Notes  °[facility] [address], [place], [place] [phone]   °[facility] [address], [place], [place] [phone]   °[facility] [address], [place], [place] ° [phone]   °[facility] [address],  °[place], [place] 1-800-853-5163 or 336-641-4981   °Substance Abuse Resources °Organization         Address  Phone  Notes  °[facility]  336-882-2125   °[facility]  [phone]   °[facility]  336-285-9073   °[facility]  336-845-3988   °Residential &  Outpatient Substance Abuse Program  1-800-659-3381   °Psychological Services °Organization         Address  Phone  Notes  °[facility]  336- [phone]   °[facility]  336- [phone]   °[facility] [address], [place] 1-800-853-5163 or 336-641-4981   ° °Mobile Crisis Teams °Organization         Address  Phone  Notes  °[facility], Mobile Crisis Care Unit  1-877-626-1772   °Assertive °Psychotherapeutic   Services  [address] [place], [place]   [facility] [address], [place] [phone]    Self-Help/Support Groups Organization         Address  Phone             Notes  [place]. of [place] - variety of support groups  [place]io Call for more information  Narcotics Anonymous (NA), [facility] [address], [employer] [place]  2 meetings at this location   Special educational needs teacher         Address  Phone  Notes  [facility] [place],    [place]  [phone]-(346) [phone]   [facility]  [address] [place] [phone], [place], [place]   [place], [place] [phone] Admissions: 8am-3pm M-F  Incentives Substance [place] [address]    [place], [place] [phone]   [facility] [address] [place], [place], [place]   [facility] [address],  [place], [place]   [facility] - Intensive Outpatient [address], [place], [place]   [facility] [place]  [place], [place] 1-512-035-3021 or [phone]   [facility] ([facility]) [address], [place], [place] Accepts Medicaid  [facility] [place] [address],  [place] 1-239-789-1735 Substance Abuse/Addiction Treatment   [facility] Organization          Address  Phone  Notes  [facility]  [phone]   Domenic Schwab, PhD [address] Arlis [place], [place]   (914) [phone] or [phone]   [place], [place] [phone]   [facility] [address] [place], [place] [phone] Insurance/Medicaid/sponsorship through [facility] [address] [place]                                    [place], [place] [phone] [place] [address], [place] [phone]    Dr. Adele Schilder  [phone]   [facility] Dept. 1) [address], [place] 2) [place] 3)  [address], [place] [phone]  [phone]   [place] [phone] or [phone] (After Hours)

## 2014-10-29 ENCOUNTER — Emergency Department (HOSPITAL_COMMUNITY): Payer: Medicaid Other

## 2014-10-29 ENCOUNTER — Encounter (HOSPITAL_COMMUNITY): Payer: Self-pay

## 2014-10-29 ENCOUNTER — Emergency Department (HOSPITAL_COMMUNITY)
Admission: EM | Admit: 2014-10-29 | Discharge: 2014-10-29 | Disposition: A | Discharge status: Home / Self Care | Payer: Medicaid Other | Attending: Emergency Medicine | Admitting: Emergency Medicine

## 2014-10-29 DIAGNOSIS — Z8742 Personal history of other diseases of the female genital tract: Secondary | ICD-10-CM | POA: Diagnosis not present

## 2014-10-29 DIAGNOSIS — Y92194 Driveway of other specified residential institution as the place of occurrence of the external cause: Secondary | ICD-10-CM | POA: Insufficient documentation

## 2014-10-29 DIAGNOSIS — O23592 Infection of other part of genital tract in pregnancy, second trimester: Secondary | ICD-10-CM

## 2014-10-29 DIAGNOSIS — O2392 Unspecified genitourinary tract infection in pregnancy, second trimester: Secondary | ICD-10-CM | POA: Insufficient documentation

## 2014-10-29 DIAGNOSIS — O9A212 Injury, poisoning and certain other consequences of external causes complicating pregnancy, second trimester: Secondary | ICD-10-CM | POA: Diagnosis not present

## 2014-10-29 DIAGNOSIS — O99511 Diseases of the respiratory system complicating pregnancy, first trimester: Secondary | ICD-10-CM | POA: Insufficient documentation

## 2014-10-29 DIAGNOSIS — O98812 Other maternal infectious and parasitic diseases complicating pregnancy, second trimester: Secondary | ICD-10-CM | POA: Diagnosis not present

## 2014-10-29 DIAGNOSIS — Y939 Activity, unspecified: Secondary | ICD-10-CM | POA: Diagnosis not present

## 2014-10-29 DIAGNOSIS — Z8719 Personal history of other diseases of the digestive system: Secondary | ICD-10-CM | POA: Diagnosis not present

## 2014-10-29 DIAGNOSIS — A5901 Trichomonal vulvovaginitis: Secondary | ICD-10-CM

## 2014-10-29 DIAGNOSIS — J45909 Unspecified asthma, uncomplicated: Secondary | ICD-10-CM | POA: Insufficient documentation

## 2014-10-29 DIAGNOSIS — R109 Unspecified abdominal pain: Secondary | ICD-10-CM

## 2014-10-29 DIAGNOSIS — IMO0002 Reserved for concepts with insufficient information to code with codable children: Secondary | ICD-10-CM

## 2014-10-29 DIAGNOSIS — O26899 Other specified pregnancy related conditions, unspecified trimester: Secondary | ICD-10-CM

## 2014-10-29 DIAGNOSIS — Z3A12 12 weeks gestation of pregnancy: Secondary | ICD-10-CM | POA: Diagnosis not present

## 2014-10-29 DIAGNOSIS — S3991XA Unspecified injury of abdomen, initial encounter: Secondary | ICD-10-CM | POA: Insufficient documentation

## 2014-10-29 DIAGNOSIS — O9989 Other specified diseases and conditions complicating pregnancy, childbirth and the puerperium: Secondary | ICD-10-CM | POA: Diagnosis present

## 2014-10-29 DIAGNOSIS — Y999 Unspecified external cause status: Secondary | ICD-10-CM | POA: Diagnosis not present

## 2014-10-29 DIAGNOSIS — O2342 Unspecified infection of urinary tract in pregnancy, second trimester: Secondary | ICD-10-CM

## 2014-10-29 DIAGNOSIS — Z792 Long term (current) use of antibiotics: Secondary | ICD-10-CM | POA: Diagnosis not present

## 2014-10-29 LAB — CBC WITH DIFFERENTIAL/PLATELET
BASOS ABS: 0 10*3/uL (ref 0.0–0.1)
BASOS PCT: 0 % (ref 0–1)
EOS ABS: 0.3 10*3/uL (ref 0.0–0.7)
EOS PCT: 3 % (ref 0–5)
HCT: 37.4 % (ref 36.0–46.0)
Hemoglobin: 12.5 g/dL (ref 12.0–15.0)
Lymphocytes Relative: 24 % (ref 12–46)
Lymphs Abs: 1.8 10*3/uL (ref 0.7–4.0)
MCH: 29.5 pg (ref 26.0–34.0)
MCHC: 33.4 g/dL (ref 30.0–36.0)
MCV: 88.2 fL (ref 78.0–100.0)
MONO ABS: 0.8 10*3/uL (ref 0.1–1.0)
Monocytes Relative: 10 % (ref 3–12)
Neutro Abs: 4.7 10*3/uL (ref 1.7–7.7)
Neutrophils Relative %: 63 % (ref 43–77)
PLATELETS: 229 10*3/uL (ref 150–400)
RBC: 4.24 MIL/uL (ref 3.87–5.11)
RDW: 12.3 % (ref 11.5–15.5)
WBC: 7.6 10*3/uL (ref 4.0–10.5)

## 2014-10-29 LAB — URINALYSIS, ROUTINE W REFLEX MICROSCOPIC
BILIRUBIN URINE: NEGATIVE
Glucose, UA: NEGATIVE mg/dL
Hgb urine dipstick: NEGATIVE
KETONES UR: NEGATIVE mg/dL
NITRITE: NEGATIVE
PH: 6 (ref 5.0–8.0)
PROTEIN: NEGATIVE mg/dL
Specific Gravity, Urine: 1.026 (ref 1.005–1.030)
Urobilinogen, UA: 1 mg/dL (ref 0.0–1.0)

## 2014-10-29 LAB — HCG, QUANTITATIVE, PREGNANCY: HCG, BETA CHAIN, QUANT, S: 215548 m[IU]/mL — AB (ref <5)

## 2014-10-29 LAB — I-STAT CHEM 8, ED
BUN: 10 mg/dL (ref 6–20)
Calcium, Ion: 1.18 mmol/L (ref 1.12–1.23)
Chloride: 104 mmol/L (ref 101–111)
Creatinine, Ser: 0.5 mg/dL (ref 0.44–1.00)
Glucose, Bld: 79 mg/dL (ref 65–99)
HEMATOCRIT: 38 % (ref 36.0–46.0)
HEMOGLOBIN: 12.9 g/dL (ref 12.0–15.0)
POTASSIUM: 3.9 mmol/L (ref 3.5–5.1)
SODIUM: 135 mmol/L (ref 135–145)
TCO2: 21 mmol/L (ref 0–100)

## 2014-10-29 LAB — URINE MICROSCOPIC-ADD ON

## 2014-10-29 MED ORDER — CEPHALEXIN 250 MG PO CAPS
1000.0000 mg | ORAL_CAPSULE | Freq: Once | ORAL | Status: AC
  Administered 2014-10-29: 1000 mg via ORAL
  Filled 2014-10-29: qty 4

## 2014-10-29 MED ORDER — METRONIDAZOLE 500 MG PO TABS
2000.0000 mg | ORAL_TABLET | Freq: Once | ORAL | Status: AC
Start: 2014-10-29 — End: 2014-10-29
  Administered 2014-10-29: 2000 mg via ORAL
  Filled 2014-10-29: qty 4

## 2014-10-29 MED ORDER — CEPHALEXIN 500 MG PO CAPS: 1000.0000 mg | ORAL_CAPSULE | Freq: Two times a day (BID) | ORAL | Status: DC

## 2014-10-29 MED ORDER — ACETAMINOPHEN 500 MG PO TABS: 500.0000 mg | ORAL_TABLET | Freq: Four times a day (QID) | ORAL | Status: DC | PRN

## 2014-10-29 MED ORDER — ACETAMINOPHEN 325 MG PO TABS
650.0000 mg | ORAL_TABLET | Freq: Once | ORAL | Status: AC
  Administered 2014-10-29: 650 mg via ORAL
  Filled 2014-10-29: qty 2
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
## 2014-10-30 LAB — URINE CULTURE

## 2014-11-10 ENCOUNTER — Other Ambulatory Visit (HOSPITAL_COMMUNITY): Payer: Self-pay | Admitting: Nurse Practitioner

## 2014-11-10 DIAGNOSIS — Z3682 Encounter for antenatal screening for nuchal translucency: Secondary | ICD-10-CM

## 2014-11-10 DIAGNOSIS — Z3A13 13 weeks gestation of pregnancy: Secondary | ICD-10-CM

## 2014-11-10 LAB — OB RESULTS CONSOLE ANTIBODY SCREEN: ANTIBODY SCREEN: NEGATIVE

## 2014-11-10 LAB — OB RESULTS CONSOLE HEPATITIS B SURFACE ANTIGEN: HEP B S AG: NEGATIVE

## 2014-11-10 LAB — OB RESULTS CONSOLE ABO/RH: RH TYPE: POSITIVE

## 2014-11-10 LAB — OB RESULTS CONSOLE GC/CHLAMYDIA
CHLAMYDIA, DNA PROBE: NEGATIVE
Gonorrhea: NEGATIVE

## 2014-11-10 LAB — OB RESULTS CONSOLE RUBELLA ANTIBODY, IGM: Rubella: IMMUNE

## 2014-11-10 LAB — OB RESULTS CONSOLE RPR: RPR: NONREACTIVE

## 2014-11-10 LAB — OB RESULTS CONSOLE HIV ANTIBODY (ROUTINE TESTING): HIV: NONREACTIVE

## 2014-11-11 ENCOUNTER — Other Ambulatory Visit (HOSPITAL_COMMUNITY): Payer: Medicaid Other

## 2014-11-14 ENCOUNTER — Ambulatory Visit (HOSPITAL_COMMUNITY)
Admission: RE | Admit: 2014-11-14 | Discharge: 2014-11-14 | Disposition: A | Discharge status: Home / Self Care | Payer: Medicaid Other | Source: Ambulatory Visit | Attending: Nurse Practitioner | Admitting: Nurse Practitioner

## 2014-11-14 ENCOUNTER — Other Ambulatory Visit (HOSPITAL_COMMUNITY): Payer: Self-pay | Admitting: Nurse Practitioner

## 2014-11-14 ENCOUNTER — Encounter (HOSPITAL_COMMUNITY): Payer: Self-pay

## 2014-11-14 DIAGNOSIS — Z3A13 13 weeks gestation of pregnancy: Secondary | ICD-10-CM | POA: Diagnosis not present

## 2014-11-14 DIAGNOSIS — O3421 Maternal care for scar from previous cesarean delivery: Secondary | ICD-10-CM | POA: Insufficient documentation

## 2014-11-14 DIAGNOSIS — O34219 Maternal care for unspecified type scar from previous cesarean delivery: Secondary | ICD-10-CM

## 2014-11-14 DIAGNOSIS — Z36 Encounter for antenatal screening of mother: Secondary | ICD-10-CM | POA: Diagnosis not present

## 2014-11-14 DIAGNOSIS — Z3682 Encounter for antenatal screening for nuchal translucency: Secondary | ICD-10-CM

## 2014-11-17 ENCOUNTER — Other Ambulatory Visit (HOSPITAL_COMMUNITY): Payer: Self-pay | Admitting: *Deleted

## 2014-11-17 DIAGNOSIS — Z3689 Encounter for other specified antenatal screening: Secondary | ICD-10-CM

## 2014-11-21 ENCOUNTER — Other Ambulatory Visit (HOSPITAL_COMMUNITY): Payer: Self-pay | Admitting: Nurse Practitioner

## 2014-12-21 ENCOUNTER — Inpatient Hospital Stay (HOSPITAL_COMMUNITY)
Admission: AD | Admit: 2014-12-21 | Discharge: 2014-12-21 | Disposition: A | Discharge status: Home / Self Care | Payer: Medicaid Other | Source: Ambulatory Visit | Attending: Family Medicine | Admitting: Family Medicine

## 2014-12-21 ENCOUNTER — Encounter (HOSPITAL_COMMUNITY): Payer: Self-pay

## 2014-12-21 DIAGNOSIS — Z3A18 18 weeks gestation of pregnancy: Secondary | ICD-10-CM

## 2014-12-21 DIAGNOSIS — O9A312 Physical abuse complicating pregnancy, second trimester: Secondary | ICD-10-CM | POA: Diagnosis not present

## 2014-12-21 DIAGNOSIS — O26892 Other specified pregnancy related conditions, second trimester: Secondary | ICD-10-CM | POA: Insufficient documentation

## 2014-12-21 DIAGNOSIS — T7411XA Adult physical abuse, confirmed, initial encounter: Secondary | ICD-10-CM

## 2014-12-21 NOTE — Discharge Instructions (Signed)
Domestic Violence Information  WHAT IS DOMESTIC VIOLENCE?   Domestic violence, also called intimate partner violence, can involve physical, emotional, psychological, sexual, and economic abuse by a current or former intimate partner. Stalking is also considered a type of domestic violence. Domestic violence can happen between people who are or were:   · Married.  · Dating.  · Living together.  Abusers repeatedly act to maintain control and power over their partner.  Physical abuse can include:  · Slapping.    · Hitting.    · Kicking.    · Punching.  · Choking.  · Pulling the victim's hair.  · Damaging the victim's property.  · Threatening or hurting the victim with weapons.  · Trapping the victim in his or her home.  · Forcing the victim to use drugs or alcohol.  Emotional and psychological abuse can include:   · Threats.  · Insults.    · Isolation.    · Humiliation.  · Jealousy and possessiveness.  · Blame.  · Withholding affection.    · Intimidation.    · Manipulation.    · Limiting contact with friends and family.    Sexual abuse can include:   · Forcing sex.    · Forcing sexual touching.    · Hurting the victim during sex.  · Forcing the victim to have sex with other people.  · Giving the victim a sexually transmitted disease (STD) on purpose.  Economic abuse can include:   · Controlling resources, such as money, food, transportation, a phone, or computer.  · Stealing money from the victim or his or her family or friends.  · Forbidding the victim to work.  · Refusing to work or to contribute to the household.  Stalking can include:  · Making repeated, unwanted phone calls, emails, or text messages.  · Leaving cards, letters, flowers or other items the victim does not want.  · Watching or following the victim from a distance.  · Going to places where the victim does not want the abuser.  · Entering the victim's home or car.  · Damaging the victim's personal property.  WHAT ARE SOME WARNING SIGNS OF DOMESTIC  VIOLENCE?   Physical signs  · Bruises.    · Broken bones.    · Burns or cuts.    · Physical pain.    · Head injury.  Emotional and psychological signs   · Crying.    · Depression.    · Hopelessness.    · Desperation.    · Trouble sleeping.    · Fear of partner.    · Anxiety.  · Suicidal behavior.  · Antisocial behavior.  · Low self-esteem.  · Fear of intimacy.  · Flashbacks.  Sexual signs   · Bruising, swelling, or bleeding of the genital or rectal area.    · Signs of a sexually transmitted infection, such as genital sores, warts, or discharge coming from the genital area.    · Pain in the genital area.    · Unintended pregnancy.  · Problems with pregnancy, including delayed prenatal care and prematurity.  Economic signs   · Having little money or food.    · Homelessness.  · Asking for or borrowing money.  WHAT ARE COMMON BEHAVIORS OF THOSE AFFECTED BY DOMESTIC VIOLENCE?  Those affected by domestic violence may:   · Be late to work or other events.    · Not show up to places as promised.    · Have to let their partner know where they are and who they are with.    · Be isolated or kept from seeing friends or family.    · Make comments about   their partner's temper or behavior.    · Make excuses for their partner.    · Engage in high-risk sexual behaviors.  · Use drugs or alcohol.  · Have unhealthy diet-related behaviors.  WHAT ARE COMMON FEELINGS OF THOSE AFFECTED BY DOMESTIC VIOLENCE?   Those affected by domestic violence may feel that:    · They have to be careful not to say or do things that trigger their partner's anger.    · They cannot do anything right.    · They deserve to be treated badly.    · They are overreacting to their partner's behavior or temper.    · They cannot trust their own feelings.    · They cannot trust other people.    · They are trapped.    · Their partner would take away their children.    · They are emotionally drained or numb.    · Their life is in danger.    · They might have to kill  their partner to survive.    WHERE CAN YOU GET HELP?   Domestic violence hotlines and websites  If you do not feel safe searching for help online at home, use a computer at a public library to access the Internet. Call 911 if you are in immediate danger or need medical help.   · The National Domestic Violence Hotline.    The 24-hour phone hotline is 1-800-799-7233 or 1-800-787-3224 (TTY).      The videophone is available Monday through Friday, 9 a.m. to 5 p.m. Call 1-855-812-1001.    The website is http://thehotline.org  · The National Sexual Assault Hotline.    The 24-hour phone hotline is 1-800-656-4673.    You can access the online hotline at https://ohl.rainn.org/online/  Shelters for victims of domestic violence    If you are a victim of domestic violence, there are resources to help you find a temporary place for you and your children to live (shelter). The specific address of these shelters is often not known to the public.   Police   Report assaults, threats, and stalking to the police.    Counselors and counseling centers  People who have been victims of domestic violence can benefit from counseling. Counseling can help you cope with difficult emotions and empower you to plan for your future safety. The topics you discuss with a counselor are private and confidential. Children of domestic violence victims also might need counseling to manage stress and anxiety.   The court system  You can work with a lawyer or an advocate to get legal protection against an abuser. Protection includes restraining orders and private addresses. Crimes against you, such as assault, can also be prosecuted through the courts. Laws vary by state.     This information is not intended to replace advice given to you by your health care provider. Make sure you discuss any questions you have with your health care provider.     Document Released: 04/30/2003 Document Revised: 02/28/2014 Document Reviewed: 10/25/2013  Elsevier Interactive  Patient Education ©2016 Elsevier Inc.
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
## 2014-12-26 ENCOUNTER — Other Ambulatory Visit (HOSPITAL_COMMUNITY): Payer: Self-pay | Admitting: Obstetrics and Gynecology

## 2014-12-26 ENCOUNTER — Ambulatory Visit (HOSPITAL_COMMUNITY)
Admission: RE | Admit: 2014-12-26 | Discharge: 2014-12-26 | Disposition: A | Discharge status: Home / Self Care | Payer: Medicaid Other | Source: Ambulatory Visit | Attending: Nurse Practitioner | Admitting: Nurse Practitioner

## 2014-12-26 DIAGNOSIS — O34219 Maternal care for unspecified type scar from previous cesarean delivery: Secondary | ICD-10-CM

## 2014-12-26 DIAGNOSIS — Z3A19 19 weeks gestation of pregnancy: Secondary | ICD-10-CM | POA: Diagnosis not present

## 2014-12-26 DIAGNOSIS — Z3689 Encounter for other specified antenatal screening: Secondary | ICD-10-CM

## 2014-12-26 DIAGNOSIS — Z36 Encounter for antenatal screening of mother: Secondary | ICD-10-CM | POA: Insufficient documentation

## 2015-01-14 ENCOUNTER — Encounter (HOSPITAL_COMMUNITY): Payer: Self-pay | Admitting: *Deleted

## 2015-01-14 ENCOUNTER — Inpatient Hospital Stay (HOSPITAL_COMMUNITY)
Admission: AD | Admit: 2015-01-14 | Discharge: 2015-01-14 | Disposition: A | Discharge status: Home / Self Care | Payer: Medicaid Other | Source: Ambulatory Visit | Attending: Family Medicine | Admitting: Family Medicine

## 2015-01-14 DIAGNOSIS — O1202 Gestational edema, second trimester: Secondary | ICD-10-CM

## 2015-01-14 DIAGNOSIS — R609 Edema, unspecified: Secondary | ICD-10-CM | POA: Diagnosis present

## 2015-01-14 DIAGNOSIS — M25562 Pain in left knee: Secondary | ICD-10-CM | POA: Diagnosis not present

## 2015-01-14 LAB — URINALYSIS, ROUTINE W REFLEX MICROSCOPIC
Bilirubin Urine: NEGATIVE
GLUCOSE, UA: NEGATIVE mg/dL
Hgb urine dipstick: NEGATIVE
Ketones, ur: NEGATIVE mg/dL
LEUKOCYTES UA: NEGATIVE
Nitrite: NEGATIVE
PH: 7.5 (ref 5.0–8.0)
PROTEIN: NEGATIVE mg/dL
Specific Gravity, Urine: 1.015 (ref 1.005–1.030)

## 2015-01-14 LAB — COMPREHENSIVE METABOLIC PANEL
ALT: 13 U/L — AB (ref 14–54)
AST: 14 U/L — AB (ref 15–41)
Albumin: 2.9 g/dL — ABNORMAL LOW (ref 3.5–5.0)
Alkaline Phosphatase: 81 U/L (ref 38–126)
Anion gap: 6 (ref 5–15)
BILIRUBIN TOTAL: 0.3 mg/dL (ref 0.3–1.2)
BUN: 8 mg/dL (ref 6–20)
CALCIUM: 9.1 mg/dL (ref 8.9–10.3)
CHLORIDE: 104 mmol/L (ref 101–111)
CO2: 25 mmol/L (ref 22–32)
CREATININE: 0.48 mg/dL (ref 0.44–1.00)
GFR calc Af Amer: >60 mL/min (ref >60)
Glucose, Bld: 96 mg/dL (ref 65–99)
Potassium: 3.8 mmol/L (ref 3.5–5.1)
Sodium: 135 mmol/L (ref 135–145)
TOTAL PROTEIN: 6.6 g/dL (ref 6.5–8.1)

## 2015-01-14 LAB — TSH: TSH: 3.891 u[IU]/mL (ref 0.350–4.500)

## 2015-01-14 MED ORDER — ACETAMINOPHEN 500 MG PO TABS: 500.0000 mg | ORAL_TABLET | Freq: Four times a day (QID) | ORAL | Status: DC | PRN

## 2015-01-14 MED ORDER — ACETAMINOPHEN 325 MG PO TABS
650.0000 mg | ORAL_TABLET | Freq: Once | ORAL | Status: AC
  Administered 2015-01-14: 650 mg via ORAL
  Filled 2015-01-14: qty 2

## 2015-01-14 NOTE — MAU Note (Addendum)
C/o bilateral swelling from the feet up to the knees since yesterday morning; painful to walk due to swelling; brought in by EMS[facility];
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
## 2015-01-14 NOTE — Discharge Instructions (Signed)
If you have SOB, chest pain, redness and pain in calf.  Please return back to the MAU   Peripheral Edema You have swelling in your legs (peripheral edema). This swelling is due to excess accumulation of salt and water in your body. Edema may be a sign of heart, kidney or liver disease, or a side effect of a medication. It may also be due to problems in the leg veins. Elevating your legs and using special support stockings may be very helpful, if the cause of the swelling is due to poor venous circulation. Avoid long periods of standing, whatever the cause. Treatment of edema depends on identifying the cause. Chips, pretzels, pickles and other salty foods should be avoided. Restricting salt in your diet is almost always needed. Water pills (diuretics) are often used to remove the excess salt and water from your body via urine. These medicines prevent the kidney from reabsorbing sodium. This increases urine flow. Diuretic treatment may also result in lowering of potassium levels in your body. Potassium supplements may be needed if you have to use diuretics daily. Daily weights can help you keep track of your progress in clearing your edema. You should call your caregiver for follow up care as recommended. SEEK IMMEDIATE MEDICAL CARE IF:   You have increased swelling, pain, redness, or heat in your legs.  You develop shortness of breath, especially when lying down.  You develop chest or abdominal pain, weakness, or fainting.  You have a fever.   This information is not intended to replace advice given to you by your health care provider. Make sure you discuss any questions you have with your health care provider.   Document Released: 03/17/2004 Document Revised: 05/02/2011 Document Reviewed: 08/20/2014 Elsevier Interactive Patient Education [employer]2016 Elsevier Inc.

## 2015-03-30 ENCOUNTER — Inpatient Hospital Stay (HOSPITAL_COMMUNITY)
Admission: AD | Admit: 2015-03-30 | Discharge: 2015-03-30 | Disposition: A | Discharge status: Home / Self Care | Payer: Medicaid Other | Source: Ambulatory Visit | Attending: Obstetrics and Gynecology | Admitting: Obstetrics and Gynecology

## 2015-03-30 ENCOUNTER — Encounter (HOSPITAL_COMMUNITY): Payer: Self-pay | Admitting: Obstetrics and Gynecology

## 2015-03-30 DIAGNOSIS — O26893 Other specified pregnancy related conditions, third trimester: Secondary | ICD-10-CM | POA: Diagnosis not present

## 2015-03-30 DIAGNOSIS — Z3A32 32 weeks gestation of pregnancy: Secondary | ICD-10-CM | POA: Diagnosis not present

## 2015-03-30 DIAGNOSIS — O4703 False labor before 37 completed weeks of gestation, third trimester: Secondary | ICD-10-CM | POA: Insufficient documentation

## 2015-03-30 DIAGNOSIS — O479 False labor, unspecified: Secondary | ICD-10-CM

## 2015-03-30 DIAGNOSIS — J45909 Unspecified asthma, uncomplicated: Secondary | ICD-10-CM | POA: Insufficient documentation

## 2015-03-30 DIAGNOSIS — R102 Pelvic and perineal pain: Secondary | ICD-10-CM | POA: Diagnosis present

## 2015-03-30 LAB — WET PREP, GENITAL
CLUE CELLS WET PREP: NONE SEEN
Sperm: NONE SEEN
Trich, Wet Prep: NONE SEEN
Yeast Wet Prep HPF POC: NONE SEEN

## 2015-03-30 LAB — URINALYSIS, ROUTINE W REFLEX MICROSCOPIC
Bilirubin Urine: NEGATIVE
GLUCOSE, UA: NEGATIVE mg/dL
Hgb urine dipstick: NEGATIVE
KETONES UR: NEGATIVE mg/dL
LEUKOCYTES UA: NEGATIVE
NITRITE: NEGATIVE
PROTEIN: NEGATIVE mg/dL
Specific Gravity, Urine: 1.01 (ref 1.005–1.030)
pH: 6.5 (ref 5.0–8.0)

## 2015-03-30 MED ORDER — ACETAMINOPHEN 325 MG PO TABS
650.0000 mg | ORAL_TABLET | Freq: Once | ORAL | Status: AC
  Administered 2015-03-30: 650 mg via ORAL
  Filled 2015-03-30: qty 2

## 2015-03-30 NOTE — MAU Note (Signed)
Patient states she has been feeling vaginal "pinching" and is concerned that she is dilating.  Denies having contractions.  Denies vaginal bleeding.  Reports +fetal movement.

## 2015-03-30 NOTE — Discharge Instructions (Signed)
Braxton Hicks Contractions Contractions of the uterus can occur throughout pregnancy. Contractions are not always a sign that you are in labor.  WHAT ARE BRAXTON HICKS CONTRACTIONS?  Contractions that occur before labor are called Braxton Hicks contractions, or false labor. Toward the end of pregnancy (32-34 weeks), these contractions can develop more often and may become more forceful. This is not true labor because these contractions do not result in opening (dilatation) and thinning of the cervix. They are sometimes difficult to tell apart from true labor because these contractions can be forceful and people have different pain tolerances. You should not feel embarrassed if you go to the hospital with false labor. Sometimes, the only way to tell if you are in true labor is for your health care provider to look for changes in the cervix. If there are no prenatal problems or other health problems associated with the pregnancy, it is completely safe to be sent home with false labor and await the onset of true labor. HOW CAN YOU TELL THE DIFFERENCE BETWEEN TRUE AND FALSE LABOR? False Labor  The contractions of false labor are usually shorter and not as hard as those of true labor.   The contractions are usually irregular.   The contractions are often felt in the front of the lower abdomen and in the groin.   The contractions may go away when you walk around or change positions while lying down.   The contractions get weaker and are shorter lasting as time goes on.   The contractions do not usually become progressively stronger, regular, and closer together as with true labor.  True Labor  Contractions in true labor last 30-70 seconds, become very regular, usually become more intense, and increase in frequency.   The contractions do not go away with walking.   The discomfort is usually felt in the top of the uterus and spreads to the lower abdomen and low back.   True labor can be  determined by your health care provider with an exam. This will show that the cervix is dilating and getting thinner.  WHAT TO REMEMBER  Keep up with your usual exercises and follow other instructions given by your health care provider.   Take medicines as directed by your health care provider.   Keep your regular prenatal appointments.   Eat and drink lightly if you think you are going into labor.   If Braxton Hicks contractions are making you uncomfortable:   Change your position from lying down or resting to walking, or from walking to resting.   Sit and rest in a tub of warm water.   Drink 2-3 glasses of water. Dehydration may cause these contractions.   Do slow and deep breathing several times an hour.  WHEN SHOULD I SEEK IMMEDIATE MEDICAL CARE? Seek immediate medical care if:  Your contractions become stronger, more regular, and closer together.   You have fluid leaking or gushing from your vagina.   You have a fever.   You pass blood-tinged mucus.   You have vaginal bleeding.   You have continuous abdominal pain.   You have low back pain that you never had before.   You feel your baby's head pushing down and causing pelvic pressure.   Your baby is not moving as much as it used to.    This information is not intended to replace advice given to you by your health care provider. Make sure you discuss any questions you have with your health care  provider.   Document Released: 02/07/2005 Document Revised: 02/12/2013 Document Reviewed: 11/19/2012 Elsevier Interactive Patient Education [employer] Third Trimester of Pregnancy The third trimester is from week 29 through week 42, months 7 through 9. This trimester is when your unborn baby (fetus) is growing very fast. At the end of the ninth month, the unborn baby is about 20 inches in length. It weighs about 6-10 pounds.  HOME CARE   Avoid all smoking, herbs, and alcohol. Avoid drugs not  approved by your doctor.  Do not use any tobacco products, including cigarettes, chewing tobacco, and electronic cigarettes. If you need help quitting, ask your doctor. You may get counseling or other support to help you quit.  Only take medicine as told by your doctor. Some medicines are safe and some are not during pregnancy.  Exercise only as told by your doctor. Stop exercising if you start having cramps.  Eat regular, healthy meals.  Wear a good support bra if your breasts are tender.  Do not use hot tubs, steam rooms, or saunas.  Wear your seat belt when driving.  Avoid raw meat, uncooked cheese, and liter boxes and soil used by cats.  Take your prenatal vitamins.  Take 1500-2000 milligrams of calcium daily starting at the 20th week of pregnancy until you deliver your baby.  Try taking medicine that helps you poop (stool softener) as needed, and if your doctor approves. Eat more fiber by eating fresh fruit, vegetables, and whole grains. Drink enough fluids to keep your pee (urine) clear or pale yellow.  Take warm water baths (sitz baths) to soothe pain or discomfort caused by hemorrhoids. Use hemorrhoid cream if your doctor approves.  If you have puffy, bulging veins (varicose veins), wear support hose. Raise (elevate) your feet for 15 minutes, 3-4 times a day. Limit salt in your diet.  Avoid heavy lifting, wear low heels, and sit up straight.  Rest with your legs raised if you have leg cramps or low back pain.  Visit your dentist if you have not gone during your pregnancy. Use a soft toothbrush to brush your teeth. Be gentle when you floss.  You can have sex (intercourse) unless your doctor tells you not to.  Do not travel far distances unless you must. Only do so with your doctor's approval.  Take prenatal classes.  Practice driving to the hospital.  Pack your hospital bag.  Prepare the baby's room.  Go to your doctor visits. GET HELP IF:  You are not sure if  you are in labor or if your water has broken.  You are dizzy.  You have mild cramps or pressure in your lower belly (abdominal).  You have a nagging pain in your belly area.  You continue to feel sick to your stomach (nauseous), throw up (vomit), or have watery poop (diarrhea).  You have bad smelling fluid coming from your vagina.  You have pain with peeing (urination). GET HELP RIGHT AWAY IF:   You have a fever.  You are leaking fluid from your vagina.  You are spotting or bleeding from your vagina.  You have severe belly cramping or pain.  You lose or gain weight rapidly.  You have trouble catching your breath and have chest pain.  You notice sudden or extreme puffiness (swelling) of your face, hands, ankles, feet, or legs.  You have not felt the baby move in over an hour.  You have severe headaches that do not go away with medicine.  You  have vision changes. °  °This information is not intended to replace advice given to you by your health care provider. Make sure you discuss any questions you have with your health care provider. °  °Document Released: 05/04/2009 Document Revised: 02/28/2014 Document Reviewed: 04/10/2012 °Elsevier Interactive Patient Education ©2016 Elsevier Inc. ° °

## 2015-03-30 NOTE — MAU Provider Note (Signed)
History     CSN: [id number]  Arrival date and time: 03/30/15 1441   First Provider Initiated Contact with Patient 03/30/15 1551      Chief Complaint  Patient presents with  . Vaginal Pain   HPI  Erica Harrell is 20 y.o. G3P1011 [redacted]w[redacted]d weeks presenting with concern for cervical dilatation.  Reports a "pinching" sensation that is transient and is rated a 7/10.  Symptoms are aggravated by changes in position and walking. She is a patient of [facility]. She denies vaginal bleeding, loss of fluids and contractions.  + for fetal movement.  + for headaches that are constant.  She does not have tylenol for them.  SOB/chest pain that occurs when the pinching if felt in her cervix and when she is sitting in a hunched position.  Neg for UTI sxs. Last intercourse 2 nights ago.   Pt. Admits to thin, clear, milky discharge that she states a provider [facility] told her was normal.   Past Medical History  Diagnosis Date  . Constipation   . Asthma   . Bacterial vaginosis   . Seasonal allergies   . Headache(784.0)   . Pelvic inflammatory disease     Past Surgical History  Procedure Laterality Date  . Vaginal delivery    . Cesarean section      Family History  Problem Relation Age of Onset  . Asthma Mother   . Diabetes Maternal Aunt   . Diabetes Maternal Uncle   . Hypertension Maternal Grandmother   . Diabetes Maternal Grandmother   . Asthma Maternal Grandmother     Social History  Substance Use Topics  . Smoking status: Never Smoker   . Smokeless tobacco: None  . Alcohol Use: No    Allergies:  Allergies  Allergen Reactions  . Shellfish Allergy Anaphylaxis  . Zoloft [Sertraline Hcl] Anaphylaxis    Prescriptions prior to admission  Medication Sig Dispense Refill Last Dose  . Prenatal Vit-Fe Fumarate-FA (PRENATAL MULTIVITAMIN) TABS tablet Take 1 tablet by mouth daily at 12 noon.   03/30/2015 at Unknown time  . acetaminophen (TYLENOL) 500 MG tablet Take 1 tablet (500 mg  total) by mouth every 6 (six) hours as needed (for knee pain). (Patient not taking: Reported on 03/30/2015) 30 tablet 0 Not Taking at Unknown time    Review of Systems  Constitutional: Negative for fever and chills.  Eyes: Negative for blurred vision and double vision.  Respiratory: Positive for shortness of breath.   Cardiovascular: Positive for chest pain. Negative for palpitations.  Gastrointestinal: Negative for nausea, vomiting, abdominal pain (negative for cramping/contractions), diarrhea and constipation.  Genitourinary: Negative for dysuria, urgency, frequency and hematuria.       Neg for vaginal bleeding.  Neurological: Positive for headaches.   Physical Exam   Blood pressure 114/56, pulse 93, temperature 97.7 F (36.5 C), temperature source Oral, resp. rate 18, height  (1.6 m), weight 197 lb 3.2 oz (89.449 kg), last menstrual period 08/12/2014, SpO2 99 %.  Physical Exam  Nursing note and vitals reviewed. Constitutional: She is oriented to person, place, and time. She appears well-developed and well-nourished.  HENT:  Head: Normocephalic.  Neck: Normal range of motion. Neck supple.  Cardiovascular: Normal rate, regular rhythm and normal heart sounds.   Respiratory: Effort normal and breath sounds normal. No respiratory distress.  GI: Soft. There is no tenderness.  Genitourinary: There is no rash, tenderness or lesion on the right labia. There is no rash, tenderness or lesion on  the left labia. Uterus is enlarged. Uterus is not tender. Cervix exhibits no motion tenderness, no discharge and no friability. No bleeding in the vagina. Vaginal discharge ( small amount of frothy thin vaginal discharge.  Neg for bleeding/clots) found.  Cervix is long, closed, posterior.  Slightly deviated to her right  Musculoskeletal: Normal range of motion. She exhibits no edema.  Neurological: She is alert and oriented to person, place, and time.  Skin: Skin is warm and dry.  Psychiatric: She  has a normal mood and affect. Her behavior is normal. Thought content normal.   FHR 152  Results for orders placed or performed during the hospital encounter of 03/30/15 (from the past 24 hour(s))  Urinalysis, Routine w reflex microscopic (not at [facility])     Status: None   Collection Time: 03/30/15  3:15 PM  Result Value Ref Range   Color, Urine YELLOW YELLOW   APPearance CLEAR CLEAR   Specific Gravity, Urine 1.010 1.005 - 1.030   pH 6.5 5.0 - 8.0   Glucose, UA NEGATIVE NEGATIVE mg/dL   Hgb urine dipstick NEGATIVE NEGATIVE   Bilirubin Urine NEGATIVE NEGATIVE   Ketones, ur NEGATIVE NEGATIVE mg/dL   Protein, ur NEGATIVE NEGATIVE mg/dL   Nitrite NEGATIVE NEGATIVE   Leukocytes, UA NEGATIVE NEGATIVE  Wet prep, genital     Status: Abnormal   Collection Time: 03/30/15  4:35 PM  Result Value Ref Range   Yeast Wet Prep HPF POC NONE SEEN NONE SEEN   Trich, Wet Prep NONE SEEN NONE SEEN   Clue Cells Wet Prep HPF POC NONE SEEN NONE SEEN   WBC, Wet Prep HPF POC MODERATE (A) NONE SEEN   Sperm NONE SEEN     MAU Course  Procedures  MDM MSE Labs Exam 02Sats 99% Meds:  Tylenol 650 given in MAU. Fetal Monitoring--NST Reactive.  Uterine irritability  18:00 Patient states her headache as been relieved with Tylenol.  She is ready for discharge.   Assessment and Plan  A:  [redacted]w[redacted]d gestation       Cervix is closed       Negative Wet prep       Braxton Hicks contractions       P:  Instructed patient to take Tylenol as needed before headache gets too bad       Report any further pain, contractions, LOF, decreased fetal movement and vaginal bleeding      Stay well hydrated.       Appt for prenatal care was scheduled for today, unable to get a ride there.  Came here.  Needs to reschedule that appt.       Mahogani Holohan,EVE M 03/30/2015, 5:16 PM

## 2015-04-24 ENCOUNTER — Inpatient Hospital Stay (HOSPITAL_COMMUNITY)
Admission: AD | Admit: 2015-04-24 | Discharge: 2015-04-24 | Disposition: A | Discharge status: Home / Self Care | Payer: Medicaid Other | Source: Ambulatory Visit | Attending: Obstetrics and Gynecology | Admitting: Obstetrics and Gynecology

## 2015-04-24 ENCOUNTER — Encounter (HOSPITAL_COMMUNITY): Payer: Self-pay | Admitting: *Deleted

## 2015-04-24 DIAGNOSIS — O2243 Hemorrhoids in pregnancy, third trimester: Secondary | ICD-10-CM | POA: Diagnosis not present

## 2015-04-24 DIAGNOSIS — N939 Abnormal uterine and vaginal bleeding, unspecified: Secondary | ICD-10-CM | POA: Diagnosis present

## 2015-04-24 DIAGNOSIS — Z3A36 36 weeks gestation of pregnancy: Secondary | ICD-10-CM | POA: Insufficient documentation

## 2015-04-24 DIAGNOSIS — K645 Perianal venous thrombosis: Secondary | ICD-10-CM

## 2015-04-24 DIAGNOSIS — J45909 Unspecified asthma, uncomplicated: Secondary | ICD-10-CM | POA: Insufficient documentation

## 2015-04-24 LAB — WET PREP, GENITAL
CLUE CELLS WET PREP: NONE SEEN
Sperm: NONE SEEN
TRICH WET PREP: NONE SEEN
Yeast Wet Prep HPF POC: NONE SEEN

## 2015-04-24 MED ORDER — HYDROCORTISONE ACETATE 25 MG RE SUPP: 25.0000 mg | Freq: Two times a day (BID) | RECTAL | Status: DC

## 2015-04-24 NOTE — MAU Provider Note (Signed)
MAU HISTORY AND PHYSICAL  Chief Complaint:  Vaginal Bleeding   Erica Harrell is a 20 y.o.  G3P1011 with IUP at 7571w3d presenting for Vaginal Bleeding  Patient at work just prior to arrival went to bathroom and when wiped noticed blood on toilet paper, like a period. Hasn't had bleeding this pregnancy.  On further questions cites presence of hemorrhoid that has become bothersome for the past week.  No abdominal pain, no contractions. No dysuria or hematuria. No recent sex. No fevers. Trich positive earlier this pregnancy.  Past Medical History  Diagnosis Date  . Constipation   . Asthma   . Bacterial vaginosis   . Seasonal allergies   . Headache(784.0)   . Pelvic inflammatory disease     Past Surgical History  Procedure Laterality Date  . Vaginal delivery    . Cesarean section      Family History  Problem Relation Age of Onset  . Asthma Mother   . Diabetes Maternal Aunt   . Diabetes Maternal Uncle   . Hypertension Maternal Grandmother   . Diabetes Maternal Grandmother   . Asthma Maternal Grandmother     Social History  Substance Use Topics  . Smoking status: Never Smoker   . Smokeless tobacco: None  . Alcohol Use: No    Allergies  Allergen Reactions  . Shellfish Allergy Anaphylaxis  . Zoloft [Sertraline Hcl] Anaphylaxis    No prescriptions prior to admission    Review of Systems - Negative except for what is mentioned in HPI.  Physical Exam  Blood pressure 120/79, pulse 90, temperature 98.2 F (36.8 C), temperature source Oral, resp. rate 18, last menstrual period 08/12/2014. GENERAL: Well-developed, well-nourished female in no acute distress.  LUNGS: Clear to auscultation bilaterally.  HEART: Regular rate and rhythm. ABDOMEN: Soft, nontender, nondistended, gravid.  EXTREMITIES: Nontender, no edema, 2+ distal pulses. Cervical Exam: closed/thick/high (rn exam) GU: normal vagina and cervix, visually closed, no bleeding Rectal: tender 1 cm thrombosed  bleeding hemorrhoid at 12 o'clock Presentation: vertex FHT:  140/mod/+a/-d Contractions: none   Labs: Results for orders placed or performed during the hospital encounter of 04/24/15 (from the past 24 hour(s))  Wet prep, genital   Collection Time: 04/24/15  7:33 PM  Result Value Ref Range   Yeast Wet Prep HPF POC NONE SEEN NONE SEEN   Trich, Wet Prep NONE SEEN NONE SEEN   Clue Cells Wet Prep HPF POC NONE SEEN NONE SEEN   WBC, Wet Prep HPF POC MODERATE (A) NONE SEEN   Sperm NONE SEEN     Imaging Studies:  No results found.  Assessment: Erica Harrell is  20 y.o. G3P1011 at 6071w3d presents with bleeding hemorrhoid. On SSE absolutely no vaginal bleeding. Also no abdominal tenderness and reactive NST. Wet prep unremarkable. Do not think this represents abruption. No previa. Exam does show a thrombosed external bleeding hemorrhoid, which I will treat thusly:  Plan: - anusol suppository - donut pillow - abruption return precautions - f/u g/c  Silvano Bilisoah B Sherriann Szuch 3/4/20178:42 AM

## 2015-04-24 NOTE — MAU Note (Addendum)
C/o vaginal bleeding that started @ 1830; has not eaten since 1500 this afternoon;

## 2015-04-24 NOTE — Discharge Instructions (Signed)
Hemorrhoids Hemorrhoids are puffy (swollen) veins around the rectum or anus. Hemorrhoids can cause pain, itching, bleeding, or irritation. HOME CARE  Eat foods with fiber, such as whole grains, beans, nuts, fruits, and vegetables. Ask your doctor about taking products with added fiber in them (fibersupplements).  Drink enough fluid to keep your pee (urine) clear or pale yellow.  Exercise often.  Go to the bathroom when you have the urge to poop. Do not wait.  Avoid straining to poop (bowel movement).  Keep the butt area dry and clean. Use wet toilet paper or moist paper towels.  Medicated creams and medicine inserted into the anus (anal suppository) may be used or applied as told.  Only take medicine as told by your doctor.  Take a warm water bath (sitz bath) for 15-20 minutes to ease pain. Do this 3-4 times a day.  Place ice packs on the area if it is tender or puffy. Use the ice packs between the warm water baths.  Put ice in a plastic bag.  Place a towel between your skin and the bag.  Leave the ice on for 15-20 minutes, 03-04 times a day.  Do not use a donut-shaped pillow or sit on the toilet for a long time. GET HELP RIGHT AWAY IF:   You have more pain that is not controlled by treatment or medicine.  You have bleeding that will not stop.  You have trouble or are unable to poop (bowel movement).  You have pain or puffiness outside the area of the hemorrhoids. MAKE SURE YOU:   Understand these instructions.  Will watch your condition.  Will get help right away if you are not doing well or get worse.   This information is not intended to replace advice given to you by your health care provider. Make sure you discuss any questions you have with your health care provider.   Document Released: 11/17/2007 Document Revised: 01/25/2012 Document Reviewed: 12/20/2011 Elsevier Interactive Patient Education 2016 Elsevier Inc. Vaginal Bleeding During Pregnancy, Third  Trimester A small amount of bleeding (spotting) from the vagina is relatively common in pregnancy. Various things can cause bleeding or spotting in pregnancy. Sometimes the bleeding is normal and is not a problem. However, bleeding during the third trimester can also be a sign of something serious for the mother and the baby. Be sure to tell your health care provider about any vaginal bleeding right away.  Some possible causes of vaginal bleeding during the third trimester include:   The placenta may be partially or completely covering the opening to the cervix (placenta previa).   The placenta may have separated from the uterus (abruption of the placenta).   There may be an infection or growth on the cervix.   You may be starting labor, called discharging of the mucus plug.   The placenta may grow into the muscle layer of the uterus (placenta accreta).  HOME CARE INSTRUCTIONS  Watch your condition for any changes. The following actions may help to lessen any discomfort you are feeling:   Follow your health care provider's instructions for limiting your activity. If your health care provider orders bed rest, you may need to stay in bed and only get up to use the bathroom. However, your health care provider may allow you to continue light activity.  If needed, make plans for someone to help with your regular activities and responsibilities while you are on bed rest.  Keep track of the number of pads you use  each day, how often you change pads, and how soaked (saturated) they are. Write this down.  Do not use tampons. Do not douche.  Do not have sexual intercourse or orgasms until approved by your health care provider.  Follow your health care provider's advice about lifting, driving, and physical activities.  If you pass any tissue from your vagina, save the tissue so you can show it to your health care provider.   Only take over-the-counter or prescription medicines as directed by  your health care provider.  Do not take aspirin because it can make you bleed.   Keep all follow-up appointments as directed by your health care provider. SEEK MEDICAL CARE IF:  You have any vaginal bleeding during any part of your pregnancy.  You have cramps or labor pains.  You have a fever, not controlled by medicine. SEEK IMMEDIATE MEDICAL CARE IF:   You have severe cramps or pain in your back or belly (abdomen).  You have chills.  You have a gush of fluid from the vagina.  You pass large clots or tissue from your vagina.  Your bleeding increases.  You feel light-headed or weak.  You pass out.  You feel less movement or no movement of the baby.  MAKE SURE YOU:  Understand these instructions.  Will watch your condition.  Will get help right away if you are not doing well or get worse.   This information is not intended to replace advice given to you by your health care provider. Make sure you discuss any questions you have with your health care provider.   Document Released: 04/30/2002 Document Revised: 02/12/2013 Document Reviewed: 10/15/2012 Elsevier Interactive Patient Education [employer]

## 2015-04-27 LAB — OB RESULTS CONSOLE GC/CHLAMYDIA
Chlamydia: NEGATIVE
GC PROBE AMP, GENITAL: NEGATIVE

## 2015-04-27 LAB — GC/CHLAMYDIA PROBE AMP (~~LOC~~) NOT AT ARMC
Chlamydia: NEGATIVE
NEISSERIA GONORRHEA: NEGATIVE

## 2015-04-27 LAB — OB RESULTS CONSOLE GBS: STREP GROUP B AG: NEGATIVE

## 2015-05-06 ENCOUNTER — Encounter (HOSPITAL_COMMUNITY): Payer: Self-pay

## 2015-05-06 ENCOUNTER — Inpatient Hospital Stay (HOSPITAL_COMMUNITY)
Admission: AD | Admit: 2015-05-06 | Discharge: 2015-05-06 | Disposition: A | Discharge status: Home / Self Care | Payer: Medicaid Other | Source: Ambulatory Visit | Attending: Obstetrics and Gynecology | Admitting: Obstetrics and Gynecology

## 2015-05-06 DIAGNOSIS — Z3A38 38 weeks gestation of pregnancy: Secondary | ICD-10-CM | POA: Diagnosis not present

## 2015-05-06 DIAGNOSIS — O26893 Other specified pregnancy related conditions, third trimester: Secondary | ICD-10-CM | POA: Insufficient documentation

## 2015-05-06 DIAGNOSIS — O34219 Maternal care for unspecified type scar from previous cesarean delivery: Secondary | ICD-10-CM | POA: Insufficient documentation

## 2015-05-06 DIAGNOSIS — O9989 Other specified diseases and conditions complicating pregnancy, childbirth and the puerperium: Secondary | ICD-10-CM | POA: Diagnosis not present

## 2015-05-06 DIAGNOSIS — R1031 Right lower quadrant pain: Secondary | ICD-10-CM | POA: Insufficient documentation

## 2015-05-06 DIAGNOSIS — N949 Unspecified condition associated with female genital organs and menstrual cycle: Secondary | ICD-10-CM

## 2015-05-06 DIAGNOSIS — Z8719 Personal history of other diseases of the digestive system: Secondary | ICD-10-CM | POA: Insufficient documentation

## 2015-05-06 LAB — URINALYSIS, ROUTINE W REFLEX MICROSCOPIC
Bilirubin Urine: NEGATIVE
Glucose, UA: NEGATIVE mg/dL
KETONES UR: NEGATIVE mg/dL
LEUKOCYTES UA: NEGATIVE
NITRITE: NEGATIVE
PROTEIN: NEGATIVE mg/dL
Specific Gravity, Urine: 1.01 (ref 1.005–1.030)
pH: 6 (ref 5.0–8.0)

## 2015-05-06 LAB — URINE MICROSCOPIC-ADD ON

## 2015-05-06 MED ORDER — ACETAMINOPHEN 325 MG PO TABS: 650.0000 mg | ORAL_TABLET | Freq: Four times a day (QID) | ORAL | Status: DC | PRN

## 2015-05-06 NOTE — MAU Provider Note (Signed)
History     CSN: [id number]  Arrival date and time: 05/06/15 1924   First Provider Initiated Contact with Patient 05/06/15 2034      Chief Complaint  Patient presents with  . Abdominal Pain   HPI Erica Harrell is a 20 yo G3P1011 at 7421w1d who presents with RLQ pain x 1 day. Patient describes pain as tightness on her right lower abdomen when laying on her left side and when ambulating. She felt the pain throughout the day today. Describes sensation as "pulling" in between her groin and her belly. Pt denies previous pain like this. Alleviating factors; laying on right side and sitting down. Pt denies trying medication for pain. Pain no associated with eating.    Admits to painful irregular contractions since last night (1 every hour).  No vaginal bleeding or LOF. Pt denies complications w/ current pregnancy.    Denies nausea, vomiting, diarrhea, constipation. Pt is usually constipated with PMH of hospitalization for constipation. Denies blood in stool or diarrhea, dysuria, edema, blurry vision, fevers. Pt reports HA yesterday but not today.   Pt reports c/s for first child 2/2 failure to progress. No complications 2/2 c/s.   Past Medical History  Diagnosis Date  . Constipation   . Asthma   . Bacterial vaginosis   . Seasonal allergies   . Headache(784.0)   . Pelvic inflammatory disease   Hemorrhoid that popped, subsequently had diarrhea. Never took laxative but did use Preparation H  Past Surgical History  Procedure Laterality Date  . Vaginal delivery    . Cesarean section      Family History  Problem Relation Age of Onset  . Asthma Mother   . Diabetes Maternal Aunt   . Diabetes Maternal Uncle   . Hypertension Maternal Grandmother   . Diabetes Maternal Grandmother   . Asthma Maternal Grandmother     Social History  Substance Use Topics  . Smoking status: Never Smoker   . Smokeless tobacco: None  . Alcohol Use: No    Allergies:  Allergies  Allergen Reactions   . Shellfish Allergy Anaphylaxis  . Zoloft [Sertraline Hcl] Anaphylaxis    Prescriptions prior to admission  Medication Sig Dispense Refill Last Dose  . Prenatal Vit-Fe Fumarate-FA (PRENATAL MULTIVITAMIN) TABS tablet Take 1 tablet by mouth daily at 12 noon.   05/05/2015 at Unknown time  . acetaminophen (TYLENOL) 500 MG tablet Take 1 tablet (500 mg total) by mouth every 6 (six) hours as needed (for knee pain). (Patient not taking: Reported on 05/06/2015) 30 tablet 0 Not Taking at Unknown time  . hydrocortisone (ANUSOL-HC) 25 MG suppository Place 1 suppository (25 mg total) rectally 2 (two) times daily. (Patient not taking: Reported on 05/06/2015) 12 suppository 1     ROS: see HPI Physical Exam   Blood pressure 123/68, pulse 97, temperature 98.3 F (36.8 C), temperature source Oral, resp. rate 18, last menstrual period 08/12/2014.  Physical Exam  Constitutional: She is oriented to person, place, and time. She appears well-developed and well-nourished.  HENT:  Head: Normocephalic and atraumatic.  Eyes: No scleral icterus.  Neck: Normal range of motion.  Cardiovascular: Normal rate, regular rhythm, normal heart sounds and intact distal pulses.   No murmur heard. Respiratory: Effort normal and breath sounds normal. No respiratory distress.  GI: Bowel sounds are normal. She exhibits distension (gravid abdomen). There is tenderness (right groin/RLQ upon deep palpation) in the right lower quadrant. There is no CVA tenderness and negative Murphy's sign. No hernia.  Musculoskeletal:  She exhibits no edema.  Neurological: She is alert and oriented to person, place, and time.  Skin: Skin is warm and dry.  Psychiatric: She has a normal mood and affect. Her behavior is normal. Judgment and thought content normal.  Dilation: Closed Effacement (%): 50 Exam by:: D Simpson RN  MAU Course  Procedures  MDM Patient presented to MAU with complaints of RLQ pain. Pain only present with ambulating and  laying on left side. Abdominal exam mostly benign besides some tenderness upon deep palpation of RLQ. Cervical exam showing closed cervix. No N/V/D, VSS and patient afebrile. FHT reactive, and only occasional contractions seen on monitor.   Assessment and Plan  Erica Harrell is a 20 yo G3P1011 at 615w1d who presents with pain in RLQ concerning for round ligament pain.   RLQ pain: Likely round ligament pain/ MSK pain from pregnancy. Pain is worse with laying on left side and ambulating, resolves by laying flat or laying on R side and when sitting down. Other unlikely differentials include inguinal hernia (although no signs on physical exam), appendicitis (although to guarding on physical exam, no N/V and afebrile), and diverticulitis (although patient has no diarrhea and is afebrile).  - Tylenol PRN for pain - Return precautions given  3rd Trimester Pregnancy: No complications thus far. Does have hx of 1 prior C-Section. FHT reactive. Cervical exam shows closed cervix. - Follow up with next regular OB appointment   Beaulah Dinning 05/06/2015, 8:37 PM   CNM attestation:  I have seen and examined this patient; I agree with above documentation in the resident's note.   Erica Harrell is a 20 y.o. G3P1011 reporting RLQ pain +FM, denies LOF, VB, contractions, vaginal discharge.  PE: BP 122/56 mmHg  Pulse 89  Temp(Src) 98.3 F (36.8 C) (Oral)  Resp 18  LMP 08/12/2014 (Exact Date) Gen: calm comfortable, NAD Resp: normal effort, no distress Abd: gravid  ROS, labs, PMH reviewed NST reactive, no ctx  Plan: - fetal kick counts reinforced, preterm labor precautions - comfort meas rev'd, including Tylenol - continue routine follow up in OB clinic  Cam Hai, CNM 9:26 PM

## 2015-05-06 NOTE — MAU Note (Signed)
Pt here for RLQ pain when moving-rates 8/10. Has not taken anything. Reporting decrease in fetal movement, although she has felt baby move since being here.

## 2015-05-06 NOTE — Discharge Instructions (Signed)
Round Ligament Pain  The round ligament is a cord of muscle and tissue that helps to support the uterus. It can become a source of pain during pregnancy if it becomes stretched or twisted as the baby grows. The pain usually begins in the second trimester of pregnancy, and it can come and go until the baby is delivered. It is not a serious problem, and it does not cause harm to the baby.  Round ligament pain is usually a short, sharp, and pinching pain, but it can also be a dull, lingering, and aching pain. The pain is felt in the lower side of the abdomen or in the groin. It usually starts deep in the groin and moves up to the outside of the hip area. Pain can occur with:   A sudden change in position.   Rolling over in bed.   Coughing or sneezing.   Physical activity.  HOME CARE INSTRUCTIONS  Watch your condition for any changes. Take these steps to help with your pain:   When the pain starts, relax. Then try:    Sitting down.    Flexing your knees up to your abdomen.    Lying on your side with one pillow under your abdomen and another pillow between your legs.    Sitting in a warm bath for 15-20 minutes or until the pain goes away.   Take over-the-counter and prescription medicines only as told by your health care provider.   Move slowly when you sit and stand.   Avoid long walks if they cause pain.   Stop or lessen your physical activities if they cause pain.  SEEK MEDICAL CARE IF:   Your pain does not go away with treatment.   You feel pain in your back that you did not have before.   Your medicine is not helping.  SEEK IMMEDIATE MEDICAL CARE IF:   You develop a fever or chills.   You develop uterine contractions.   You develop vaginal bleeding.   You develop nausea or vomiting.   You develop diarrhea.   You have pain when you urinate.     This information is not intended to replace advice given to you by your health care provider. Make sure you discuss any questions you have with your health  care provider.     Document Released: 11/17/2007 Document Revised: 05/02/2011 Document Reviewed: 04/16/2014  Elsevier Interactive Patient Education 2016 [employer]

## 2015-05-06 NOTE — MAU Note (Signed)
Pt presents complaining of right lower abdominal pain that feels like it's pulling. Worse with movement. Reports good fetal movement. Denies bleeding or leaking.

## 2015-05-20 ENCOUNTER — Other Ambulatory Visit (HOSPITAL_COMMUNITY): Payer: Self-pay | Admitting: Nurse Practitioner

## 2015-05-20 DIAGNOSIS — O480 Post-term pregnancy: Secondary | ICD-10-CM

## 2015-05-21 ENCOUNTER — Telehealth (HOSPITAL_COMMUNITY): Payer: Self-pay | Admitting: *Deleted

## 2015-05-21 ENCOUNTER — Encounter (HOSPITAL_COMMUNITY): Payer: Self-pay | Admitting: *Deleted

## 2015-05-21 ENCOUNTER — Encounter (HOSPITAL_COMMUNITY): Payer: Self-pay

## 2015-05-21 ENCOUNTER — Inpatient Hospital Stay (HOSPITAL_COMMUNITY)
Admission: AD | Admit: 2015-05-21 | Discharge: 2015-05-21 | Disposition: A | Discharge status: Home / Self Care | Payer: Medicaid Other | Source: Ambulatory Visit | Attending: Obstetrics & Gynecology | Admitting: Obstetrics & Gynecology

## 2015-05-21 DIAGNOSIS — Z3493 Encounter for supervision of normal pregnancy, unspecified, third trimester: Secondary | ICD-10-CM | POA: Diagnosis not present

## 2015-05-21 NOTE — Telephone Encounter (Signed)
Preadmission screen  

## 2015-05-21 NOTE — MAU Note (Signed)
Patinet presents with ctxs 10 mins apart. Patient denies any bleeding or leaking of fluid. Fetus active

## 2015-05-21 NOTE — MAU Note (Signed)
Notified provider that patient came in for ctxs 10 mins apart. Fetus reactive. Patient finger tip on cervical exam. Provider said patient could be discharged after a reactive strip .

## 2015-05-21 NOTE — Discharge Instructions (Signed)
Braxton Hicks Contractions °Contractions of the uterus can occur throughout pregnancy. Contractions are not always a sign that you are in labor.  °WHAT ARE BRAXTON HICKS CONTRACTIONS?  °Contractions that occur before labor are called Braxton Hicks contractions, or false labor. Toward the end of pregnancy (32-34 weeks), these contractions can develop more often and may become more forceful. This is not true labor because these contractions do not result in opening (dilatation) and thinning of the cervix. They are sometimes difficult to tell apart from true labor because these contractions can be forceful and people have different pain tolerances. You should not feel embarrassed if you go to the hospital with false labor. Sometimes, the only way to tell if you are in true labor is for your health care provider to look for changes in the cervix. °If there are no prenatal problems or other health problems associated with the pregnancy, it is completely safe to be sent home with false labor and await the onset of true labor. °HOW CAN YOU TELL THE DIFFERENCE BETWEEN TRUE AND FALSE LABOR? °False Labor °· The contractions of false labor are usually shorter and not as hard as those of true labor.   °· The contractions are usually irregular.   °· The contractions are often felt in the front of the lower abdomen and in the groin.   °· The contractions may go away when you walk around or change positions while lying down.   °· The contractions get weaker and are shorter lasting as time goes on.   °· The contractions do not usually become progressively stronger, regular, and closer together as with true labor.   °True Labor °· Contractions in true labor last 30-70 seconds, become very regular, usually become more intense, and increase in frequency.   °· The contractions do not go away with walking.   °· The discomfort is usually felt in the top of the uterus and spreads to the lower abdomen and low back.   °· True labor can be  determined by your health care provider with an exam. This will show that the cervix is dilating and getting thinner.   °WHAT TO REMEMBER °· Keep up with your usual exercises and follow other instructions given by your health care provider.   °· Take medicines as directed by your health care provider.   °· Keep your regular prenatal appointments.   °· Eat and drink lightly if you think you are going into labor.   °· If Braxton Hicks contractions are making you uncomfortable:   °¨ Change your position from lying down or resting to walking, or from walking to resting.   °¨ Sit and rest in a tub of warm water.   °¨ Drink 2-3 glasses of water. Dehydration may cause these contractions.   °¨ Do slow and deep breathing several times an hour.   °WHEN SHOULD I SEEK IMMEDIATE MEDICAL CARE? °Seek immediate medical care if: °· Your contractions become stronger, more regular, and closer together.   °· You have fluid leaking or gushing from your vagina.   °· You have a fever.   °· You pass blood-tinged mucus.   °· You have vaginal bleeding.   °· You have continuous abdominal pain.   °· You have low back pain that you never had before.   °· You feel your baby's head pushing down and causing pelvic pressure.   °· Your baby is not moving as much as it used to.   °  °This information is not intended to replace advice given to you by your health care provider. Make sure you discuss any questions you have with your health care   provider. °  °Document Released: 02/07/2005 Document Revised: 02/12/2013 Document Reviewed: 11/19/2012 °Elsevier Interactive Patient Education ©2016 [employer] °Fetal Movement Counts °Patient Name: __________________________________________________ Patient Due Date: ____________________ °Performing a fetal movement count is highly recommended in high-risk pregnancies, but it is good for every pregnant woman to do. Your health care provider may ask you to start counting fetal movements at 28 weeks of the  pregnancy. Fetal movements often increase: °· After eating a full meal. °· After physical activity. °· After eating or drinking something sweet or cold. °· At rest. °Pay attention to when you feel the baby is most active. This will help you notice a pattern of your baby's sleep and wake cycles and what factors contribute to an increase in fetal movement. It is important to perform a fetal movement count at the same time each day when your baby is normally most active.  °HOW TO COUNT FETAL MOVEMENTS °· Find a quiet and comfortable area to sit or lie down on your left side. Lying on your left side provides the best blood and oxygen circulation to your baby. °· Write down the day and time on a sheet of paper or in a journal. °· Start counting kicks, flutters, swishes, rolls, or jabs in a 2-hour period. You should feel at least 10 movements within 2 hours. °· If you do not feel 10 movements in 2 hours, wait 2-3 hours and count again. Look for a change in the pattern or not enough counts in 2 hours. °SEEK MEDICAL CARE IF: °· You feel less than 10 counts in 2 hours, tried twice. °· There is no movement in over an hour. °· The pattern is changing or taking longer each day to reach 10 counts in 2 hours. °· You feel the baby is not moving as he or she usually does. °Date: ____________ Movements: ____________ Start time: ____________ Finish time: ____________  °Date: ____________ Movements: ____________ Start time: ____________ Finish time: ____________ °Date: ____________ Movements: ____________ Start time: ____________ Finish time: ____________ °Date: ____________ Movements: ____________ Start time: ____________ Finish time: ____________ °Date: ____________ Movements: ____________ Start time: ____________ Finish time: ____________ °Date: ____________ Movements: ____________ Start time: ____________ Finish time: ____________ °Date: ____________ Movements: ____________ Start time: ____________ Finish time: ____________ °Date:  ____________ Movements: ____________ Start time: ____________ Finish time: ____________  °Date: ____________ Movements: ____________ Start time: ____________ Finish time: ____________ °Date: ____________ Movements: ____________ Start time: ____________ Finish time: ____________ °Date: ____________ Movements: ____________ Start time: ____________ Finish time: ____________ °Date: ____________ Movements: ____________ Start time: ____________ Finish time: ____________ °Date: ____________ Movements: ____________ Start time: ____________ Finish time: ____________ °Date: ____________ Movements: ____________ Start time: ____________ Finish time: ____________ °Date: ____________ Movements: ____________ Start time: ____________ Finish time: ____________  °Date: ____________ Movements: ____________ Start time: ____________ Finish time: ____________ °Date: ____________ Movements: ____________ Start time: ____________ Finish time: ____________ °Date: ____________ Movements: ____________ Start time: ____________ Finish time: ____________ °Date: ____________ Movements: ____________ Start time: ____________ Finish time: ____________ °Date: ____________ Movements: ____________ Start time: ____________ Finish time: ____________ °Date: ____________ Movements: ____________ Start time: ____________ Finish time: ____________ °Date: ____________ Movements: ____________ Start time: ____________ Finish time: ____________  °Date: ____________ Movements: ____________ Start time: ____________ Finish time: ____________ °Date: ____________ Movements: ____________ Start time: ____________ Finish time: ____________ °Date: ____________ Movements: ____________ Start time: ____________ Finish time: ____________ °Date: ____________ Movements: ____________ Start time: ____________ Finish time: ____________ °Date: ____________ Movements: ____________ Start time: ____________ Finish time: ____________ °Date: ____________ Movements: ____________ Start  time: ____________ Finish time:   ____________ °Date: ____________ Movements: ____________ Start time: ____________ Finish time: ____________  °Date: ____________ Movements: ____________ Start time: ____________ Finish time: ____________ °Date: ____________ Movements: ____________ Start time: ____________ Finish time: ____________ °Date: ____________ Movements: ____________ Start time: ____________ Finish time: ____________ °Date: ____________ Movements: ____________ Start time: ____________ Finish time: ____________ °Date: ____________ Movements: ____________ Start time: ____________ Finish time: ____________ °Date: ____________ Movements: ____________ Start time: ____________ Finish time: ____________ °Date: ____________ Movements: ____________ Start time: ____________ Finish time: ____________  °Date: ____________ Movements: ____________ Start time: ____________ Finish time: ____________ °Date: ____________ Movements: ____________ Start time: ____________ Finish time: ____________ °Date: ____________ Movements: ____________ Start time: ____________ Finish time: ____________ °Date: ____________ Movements: ____________ Start time: ____________ Finish time: ____________ °Date: ____________ Movements: ____________ Start time: ____________ Finish time: ____________ °Date: ____________ Movements: ____________ Start time: ____________ Finish time: ____________ °Date: ____________ Movements: ____________ Start time: ____________ Finish time: ____________  °Date: ____________ Movements: ____________ Start time: ____________ Finish time: ____________ °Date: ____________ Movements: ____________ Start time: ____________ Finish time: ____________ °Date: ____________ Movements: ____________ Start time: ____________ Finish time: ____________ °Date: ____________ Movements: ____________ Start time: ____________ Finish time: ____________ °Date: ____________ Movements: ____________ Start time: ____________ Finish time:  ____________ °Date: ____________ Movements: ____________ Start time: ____________ Finish time: ____________ °Date: ____________ Movements: ____________ Start time: ____________ Finish time: ____________  °Date: ____________ Movements: ____________ Start time: ____________ Finish time: ____________ °Date: ____________ Movements: ____________ Start time: ____________ Finish time: ____________ °Date: ____________ Movements: ____________ Start time: ____________ Finish time: ____________ °Date: ____________ Movements: ____________ Start time: ____________ Finish time: ____________ °Date: ____________ Movements: ____________ Start time: ____________ Finish time: ____________ °Date: ____________ Movements: ____________ Start time: ____________ Finish time: ____________ °  °This information is not intended to replace advice given to you by your health care provider. Make sure you discuss any questions you have with your health care provider. °  °Document Released: 03/09/2006 Document Revised: 02/28/2014 Document Reviewed: 12/05/2011 °Elsevier Interactive Patient Education ©2016 [employer] Inc[employer] ° °

## 2015-05-22 ENCOUNTER — Other Ambulatory Visit (HOSPITAL_COMMUNITY): Payer: Self-pay | Admitting: Nurse Practitioner

## 2015-05-22 ENCOUNTER — Ambulatory Visit (HOSPITAL_COMMUNITY)
Admission: RE | Admit: 2015-05-22 | Discharge: 2015-05-22 | Disposition: A | Discharge status: Home / Self Care | Payer: Medicaid Other | Source: Ambulatory Visit | Attending: Nurse Practitioner | Admitting: Nurse Practitioner

## 2015-05-22 DIAGNOSIS — Z3A40 40 weeks gestation of pregnancy: Secondary | ICD-10-CM | POA: Diagnosis not present

## 2015-05-22 DIAGNOSIS — O34219 Maternal care for unspecified type scar from previous cesarean delivery: Secondary | ICD-10-CM | POA: Insufficient documentation

## 2015-05-22 DIAGNOSIS — O480 Post-term pregnancy: Secondary | ICD-10-CM

## 2015-05-22 DIAGNOSIS — O34211 Maternal care for low transverse scar from previous cesarean delivery: Secondary | ICD-10-CM

## 2015-05-26 ENCOUNTER — Inpatient Hospital Stay (HOSPITAL_COMMUNITY)
Admission: RE | Admit: 2015-05-26 | Discharge: 2015-05-29 | DRG: 766 | Disposition: A | Discharge status: Home / Self Care | Payer: Medicaid Other | Source: Ambulatory Visit | Attending: Obstetrics & Gynecology | Admitting: Obstetrics & Gynecology

## 2015-05-26 ENCOUNTER — Encounter (HOSPITAL_COMMUNITY): Payer: Self-pay

## 2015-05-26 DIAGNOSIS — F329 Major depressive disorder, single episode, unspecified: Secondary | ICD-10-CM | POA: Diagnosis present

## 2015-05-26 DIAGNOSIS — Z833 Family history of diabetes mellitus: Secondary | ICD-10-CM | POA: Diagnosis not present

## 2015-05-26 DIAGNOSIS — O9962 Diseases of the digestive system complicating childbirth: Secondary | ICD-10-CM | POA: Diagnosis present

## 2015-05-26 DIAGNOSIS — Z825 Family history of asthma and other chronic lower respiratory diseases: Secondary | ICD-10-CM

## 2015-05-26 DIAGNOSIS — Z3A41 41 weeks gestation of pregnancy: Secondary | ICD-10-CM

## 2015-05-26 DIAGNOSIS — O34211 Maternal care for low transverse scar from previous cesarean delivery: Secondary | ICD-10-CM | POA: Diagnosis present

## 2015-05-26 DIAGNOSIS — O9952 Diseases of the respiratory system complicating childbirth: Secondary | ICD-10-CM | POA: Diagnosis present

## 2015-05-26 DIAGNOSIS — O480 Post-term pregnancy: Secondary | ICD-10-CM | POA: Diagnosis present

## 2015-05-26 DIAGNOSIS — Z6836 Body mass index (BMI) 36.0-36.9, adult: Secondary | ICD-10-CM

## 2015-05-26 DIAGNOSIS — O99344 Other mental disorders complicating childbirth: Secondary | ICD-10-CM | POA: Diagnosis present

## 2015-05-26 DIAGNOSIS — K219 Gastro-esophageal reflux disease without esophagitis: Secondary | ICD-10-CM | POA: Diagnosis present

## 2015-05-26 DIAGNOSIS — O99214 Obesity complicating childbirth: Secondary | ICD-10-CM | POA: Diagnosis present

## 2015-05-26 DIAGNOSIS — E669 Obesity, unspecified: Secondary | ICD-10-CM | POA: Diagnosis present

## 2015-05-26 DIAGNOSIS — J45909 Unspecified asthma, uncomplicated: Secondary | ICD-10-CM | POA: Diagnosis present

## 2015-05-26 DIAGNOSIS — Z8249 Family history of ischemic heart disease and other diseases of the circulatory system: Secondary | ICD-10-CM | POA: Diagnosis not present

## 2015-05-26 LAB — CBC
HCT: 33.1 % — ABNORMAL LOW (ref 36.0–46.0)
Hemoglobin: 11.2 g/dL — ABNORMAL LOW (ref 12.0–15.0)
MCH: 29.5 pg (ref 26.0–34.0)
MCHC: 33.8 g/dL (ref 30.0–36.0)
MCV: 87.1 fL (ref 78.0–100.0)
Platelets: 194 10*3/uL (ref 150–400)
RBC: 3.8 MIL/uL — ABNORMAL LOW (ref 3.87–5.11)
RDW: 13.2 % (ref 11.5–15.5)
WBC: 8.8 10*3/uL (ref 4.0–10.5)

## 2015-05-26 LAB — TYPE AND SCREEN
ABO/RH(D): AB POS
Antibody Screen: NEGATIVE

## 2015-05-26 LAB — ABO/RH: ABO/RH(D): AB POS

## 2015-05-26 LAB — RPR: RPR: NONREACTIVE

## 2015-05-26 MED ORDER — LACTATED RINGERS IV SOLN
INTRAVENOUS | Status: DC
  Administered 2015-05-26 (×2): 125 mL/h via INTRAVENOUS
  Administered 2015-05-27 (×2): via INTRAVENOUS

## 2015-05-26 MED ORDER — ZOLPIDEM TARTRATE 5 MG PO TABS
5.0000 mg | ORAL_TABLET | Freq: Every day | ORAL | Status: DC
  Administered 2015-05-26: 5 mg via ORAL
  Filled 2015-05-26: qty 1

## 2015-05-26 MED ORDER — DIPHENHYDRAMINE HCL 50 MG/ML IJ SOLN: 12.5000 mg | INTRAMUSCULAR | Status: DC | PRN

## 2015-05-26 MED ORDER — FENTANYL 2.5 MCG/ML BUPIVACAINE 1/10 % EPIDURAL INFUSION (WH - ANES): 13.5000 mL/h | INTRAMUSCULAR | Status: DC | PRN

## 2015-05-26 MED ORDER — OXYTOCIN 10 UNIT/ML IJ SOLN
1.0000 m[IU]/min | INTRAVENOUS | Status: DC
  Administered 2015-05-26: 2 m[IU]/min via INTRAVENOUS

## 2015-05-26 MED ORDER — TERBUTALINE SULFATE 1 MG/ML IJ SOLN: 0.2500 mg | Freq: Once | INTRAMUSCULAR | Status: DC | PRN

## 2015-05-26 MED ORDER — CITRIC ACID-SODIUM CITRATE 334-500 MG/5ML PO SOLN
30.0000 mL | ORAL | Status: DC | PRN
  Administered 2015-05-27: 30 mL via ORAL
  Filled 2015-05-26: qty 15

## 2015-05-26 MED ORDER — ONDANSETRON HCL 4 MG/2ML IJ SOLN: 4.0000 mg | Freq: Four times a day (QID) | INTRAMUSCULAR | Status: DC | PRN

## 2015-05-26 MED ORDER — FENTANYL CITRATE (PF) 100 MCG/2ML IJ SOLN: 100.0000 ug | INTRAMUSCULAR | Status: DC | PRN

## 2015-05-26 MED ORDER — ACETAMINOPHEN 325 MG PO TABS: 650.0000 mg | ORAL_TABLET | ORAL | Status: DC | PRN

## 2015-05-26 MED ORDER — OXYTOCIN BOLUS FROM INFUSION: 500.0000 mL | INTRAVENOUS | Status: DC

## 2015-05-26 MED ORDER — OXYTOCIN 10 UNIT/ML IJ SOLN
2.5000 [IU]/h | INTRAVENOUS | Status: DC
  Filled 2015-05-26: qty 4

## 2015-05-26 MED ORDER — LACTATED RINGERS IV SOLN: 500.0000 mL | INTRAVENOUS | Status: DC | PRN

## 2015-05-26 MED ORDER — PHENYLEPHRINE 40 MCG/ML (10ML) SYRINGE FOR IV PUSH (FOR BLOOD PRESSURE SUPPORT): 80.0000 ug | PREFILLED_SYRINGE | INTRAVENOUS | Status: DC | PRN

## 2015-05-26 MED ORDER — LIDOCAINE HCL (PF) 1 % IJ SOLN: 30.0000 mL | INTRAMUSCULAR | Status: DC | PRN

## 2015-05-26 NOTE — Progress Notes (Signed)
Erica Harrell is a 20 y.o. G3P1011 at 8528w0d by LMP admitted for induction of labor due to Post dates. Due date 05/19/15.  Subjective: Pt is doing well. She states she is feeling pretty good, but is hungry. She has no other concerns right now.  Objective: BP 123/66 mmHg  Pulse 100  Temp(Src) 98.3 F (36.8 C) (Oral)  Resp 20  Ht 5\' 3"  (1.6 m)  Wt 207 lb (93.895 kg)  BMI 36.68 kg/m2  LMP 08/12/2014 (Exact Date)     FHT:  FHR: 140 bpm, variability: moderate,  accelerations:  Present,  decelerations:  Absent UC:   regular, every 2-3 minutes SVE:   Dilation: Fingertip Effacement (%): 50 Station: Ballotable Exam by:: Valentina Lucks. Woods, RN  Labs: Lab Results  Component Value Date   WBC 8.8 05/26/2015   HGB 11.2* 05/26/2015   HCT 33.1* 05/26/2015   MCV 87.1 05/26/2015   PLT 194 05/26/2015    Assessment / Plan: Induction of labor due to postterm,  progressing well on pitocin  Labor: Progressing normally Preeclampsia:  no signs or symptoms of toxicity Fetal Wellbeing:  Category I Pain Control:  Labor support without medications I/D:  n/a Anticipated MOD:  TOLAC  Jinny BlossomKaty D Felicite Zeimet 05/26/2015, 3:32 PM

## 2015-05-26 NOTE — Anesthesia Preprocedure Evaluation (Addendum)
Anesthesia Evaluation  Patient identified by MRN, date of birth, ID band Patient awake    Reviewed: Allergy & Precautions, Patient's Chart, lab work & pertinent test results  Airway Mallampati: III  TM Distance: >3 FB Neck ROM: Full    Dental no notable dental hx. (+) Teeth Intact   Pulmonary asthma ,    Pulmonary exam normal breath sounds clear to auscultation       Cardiovascular negative cardio ROS Normal cardiovascular exam Rhythm:Regular Rate:Normal     Neuro/Psych  Headaches, PSYCHIATRIC DISORDERS Depression    GI/Hepatic Neg liver ROS, GERD  Medicated and Controlled,  Endo/Other  Obesity   Renal/GU negative Renal ROS  negative genitourinary   Musculoskeletal negative musculoskeletal ROS (+)   Abdominal (+) + obese,   Peds  Hematology  (+) anemia ,   Anesthesia Other Findings   Reproductive/Obstetrics (+) Pregnancy Hx/o PID                             Lab Results  Component Value Date   WBC 8.8 05/26/2015   HGB 11.2* 05/26/2015   HCT 33.1* 05/26/2015   MCV 87.1 05/26/2015   PLT 194 05/26/2015   Lab Results  Component Value Date   CREATININE 0.48 01/14/2015   BUN 8 01/14/2015   NA 135 01/14/2015   K 3.8 01/14/2015   CL 104 01/14/2015   CO2 25 01/14/2015    Anesthesia Physical Anesthesia Plan  ASA: III  Anesthesia Plan: Spinal   Post-op Pain Management:    Induction:   Airway Management Planned: Natural Airway  Additional Equipment:   Intra-op Plan:   Post-operative Plan:   Informed Consent: I have reviewed the patients History and Physical, chart, labs and discussed the procedure including the risks, benefits and alternatives for the proposed anesthesia with the patient or authorized representative who has indicated his/her understanding and acceptance.     Plan Discussed with: Anesthesiologist  Anesthesia Plan Comments:        Anesthesia Quick  Evaluation

## 2015-05-26 NOTE — Progress Notes (Signed)
Ala DachJoneshia Grieshaber is a 20 y.o. G3P1011 at 347w0d by LMP admitted for induction of labor due to Post dates. Due date 05/19/15.  Subjective: Pt is doing well. She states she is feeling pretty good, but is hungry. Discussed FB placement  Objective: BP 114/57 mmHg  Pulse 79  Temp(Src) 98.5 F (36.9 C) (Oral)  Resp 20  Ht 5\' 3"  (1.6 m)  Wt 207 lb (93.895 kg)  BMI 36.68 kg/m2  LMP 08/12/2014 (Exact Date)     FHT:  FHR: 140 bpm, variability: moderate,  accelerations:  Present,  decelerations:  Absent UC:   regular, every 2-3 minutes SVE:   Dilation: Fingertip Effacement (%): 50 Station: Ballotable Exam by:: Valentina Lucks. Woods, RN   Confirmed vertex by US FB placed with speculum and ring forceps and inflated with 60cc LR  Labs: Lab Results  Component Value Date   WBC 8.8 05/26/2015   HGB 11.2* 05/26/2015   HCT 33.1* 05/26/2015   MCV 87.1 05/26/2015   PLT 194 05/26/2015    Assessment / Plan: Induction of labor due to postterm,  progressing well on pitocin  Labor: FB placed with speculum Preeclampsia:  no signs or symptoms of toxicity Fetal Wellbeing:  Category I Pain Control:  Labor support without medications. PRn fentanyl I/D:  n/a Anticipated MOD:  TOLAC, hopeful for vaginal delivery  Federico FlakeKimberly Niles Steward Sames 05/26/2015, 6:08 PM

## 2015-05-26 NOTE — H&P (Signed)
LABOR ADMISSION HISTORY AND PHYSICAL  Erica Harrell is a 20 y.o. female G3P1011 with IUP at 5865w0d by LMP presenting for IOL for postdates. She reports +FMs.  Denies LOF, VB, blurry vision, headaches, peripheral edema, RUQ pain.  She plans on bottle feeding. She requests Depo for birth control.  She is undecided for pediatric care after discharge.   Dating: By LMP--->  Estimated Date of Delivery: 05/19/15  Sono:    @[redacted]w[redacted]d , CWD, normal anatomy, cephalic presentation   Prenatal History/Complications:  Past Medical History: Past Medical History  Diagnosis Date  . Constipation   . Asthma   . Bacterial vaginosis   . Seasonal allergies   . Headache(784.0)   . Pelvic inflammatory disease   . Depression     Past Surgical History: Past Surgical History  Procedure Laterality Date  . Vaginal delivery    . Cesarean section      Obstetrical History: OB History    Gravida Para Term Preterm AB TAB SAB Ectopic Multiple Living   3 1 1  1  1   1       Obstetric Comments   Elective C/S      Social History: Social History   Social History  . Marital Status: Single    Spouse Name: N/A  . Number of Children: N/A  . Years of Education: N/A   Social History Main Topics  . Smoking status: Never Smoker   . Smokeless tobacco: Never Used  . Alcohol Use: No  . Drug Use: No  . Sexual Activity: Yes    Birth Control/ Protection: None   Other Topics Concern  . None   Social History Narrative    Family History: Family History  Problem Relation Age of Onset  . Asthma Mother   . Diabetes Maternal Aunt   . Diabetes Maternal Uncle   . Hypertension Maternal Grandmother   . Diabetes Maternal Grandmother   . Asthma Maternal Grandmother     Allergies: Allergies  Allergen Reactions  . Shellfish Allergy Anaphylaxis  . Zoloft [Sertraline Hcl] Anaphylaxis    Prescriptions prior to admission  Medication Sig Dispense Refill Last Dose  . albuterol (PROVENTIL HFA;VENTOLIN HFA)  108 (90 Base) MCG/ACT inhaler Inhale 2 puffs into the lungs every 6 (six) hours as needed for wheezing or shortness of breath.     . Prenatal Vit-Fe Fumarate-FA (PRENATAL MULTIVITAMIN) TABS tablet Take 1 tablet by mouth daily at 12 noon.   05/25/2015 at Unknown time     Review of Systems   All systems reviewed and negative except as stated in HPI  Blood pressure 117/68, pulse 89, temperature 98.6 F (37 C), temperature source Oral, resp. rate 20, height 5\' 3"  (1.6 m), weight 207 lb (93.895 kg), last menstrual period 08/12/2014. General appearance: alert and cooperative Lungs: clear to auscultation bilaterally, no increased WOB Heart: regular rate and rhythm, no m/r/g Abdomen: soft, non-tender; bowel sounds normal Pelvic: Cervix is soft and fingertip Extremities: WWP, Homans sign is negative, no sign of DVT, +2 DP Neuro: patellar DTRs normal Presentation: cephalic Fetal monitoringBaseline: 140 bpm, good variability Uterine activityFrequency: Every 2 minutes Dilation: Fingertip Station: Ballotable Exam by:: Dr. Adrian BlackwaterStinson   Prenatal labs: ABO, Rh: AB/Positive/-- (09/19 0000) Antibody: Negative (09/19 0000) Rubella: !Error! RPR: Nonreactive (09/19 0000)  HBsAg: Negative (09/19 0000)  HIV: Non-reactive (09/19 0000)  GBS: Negative (03/06 0000)  1 hr Glucola negative Genetic screening negative Anatomy US normal  Prenatal Transfer Tool  Maternal Diabetes: No Genetic Screening: Normal Maternal  Ultrasounds/Referrals: Normal Fetal Ultrasounds or other Referrals:  None Maternal Substance Abuse:  No Significant Maternal Medications:  None Significant Maternal Lab Results: None  Results for orders placed or performed during the hospital encounter of 05/26/15 (from the past 24 hour(s))  CBC   Collection Time: 05/26/15  7:34 AM  Result Value Ref Range   WBC 8.8 4.0 - 10.5 K/uL   RBC 3.80 (L) 3.87 - 5.11 MIL/uL   Hemoglobin 11.2 (L) 12.0 - 15.0 g/dL   HCT 57.8 (L) 46.9 - 62.9 %    MCV 87.1 78.0 - 100.0 fL   MCH 29.5 26.0 - 34.0 pg   MCHC 33.8 30.0 - 36.0 g/dL   RDW 52.8 41.3 - 24.4 %   Platelets 194 150 - 400 K/uL    Patient Active Problem List   Diagnosis Date Noted  . Post term pregnancy, 41 weeks 05/26/2015    Assessment: Erica Harrell is a 20 y.o. G3P1011 at [redacted]w[redacted]d here for IOL for postdates  #Labor: IOL. Attempt VBAC. Pitocin for augmentation. Currently having contractions every 2 minutes. #Pain: Well-controlled. Fentanyl prn. #FWB: Category 1 #ID:  GBS negative #MOF: Formula #MOC: Depo #Circ:  Not applicable, female baby  Hilton Sinclair 05/26/2015, 8:52 AM

## 2015-05-27 ENCOUNTER — Encounter (HOSPITAL_COMMUNITY)
Admission: RE | Disposition: A | Discharge status: Home / Self Care | Payer: Self-pay | Source: Ambulatory Visit | Attending: Obstetrics & Gynecology

## 2015-05-27 ENCOUNTER — Encounter (HOSPITAL_COMMUNITY): Payer: Self-pay

## 2015-05-27 ENCOUNTER — Inpatient Hospital Stay (HOSPITAL_COMMUNITY): Payer: Medicaid Other | Admitting: Anesthesiology

## 2015-05-27 DIAGNOSIS — Z3A41 41 weeks gestation of pregnancy: Secondary | ICD-10-CM

## 2015-05-27 DIAGNOSIS — O480 Post-term pregnancy: Secondary | ICD-10-CM

## 2015-05-27 DIAGNOSIS — O99214 Obesity complicating childbirth: Secondary | ICD-10-CM

## 2015-05-27 DIAGNOSIS — O99344 Other mental disorders complicating childbirth: Secondary | ICD-10-CM

## 2015-05-27 DIAGNOSIS — O34211 Maternal care for low transverse scar from previous cesarean delivery: Secondary | ICD-10-CM

## 2015-05-27 DIAGNOSIS — O9962 Diseases of the digestive system complicating childbirth: Secondary | ICD-10-CM

## 2015-05-27 DIAGNOSIS — F329 Major depressive disorder, single episode, unspecified: Secondary | ICD-10-CM

## 2015-05-27 DIAGNOSIS — O9952 Diseases of the respiratory system complicating childbirth: Secondary | ICD-10-CM

## 2015-05-27 SURGERY — Surgical Case
Anesthesia: Spinal

## 2015-05-27 MED ORDER — ACETAMINOPHEN 325 MG PO TABS
650.0000 mg | ORAL_TABLET | ORAL | Status: DC | PRN
  Administered 2015-05-28 (×2): 650 mg via ORAL
  Filled 2015-05-27 (×2): qty 2

## 2015-05-27 MED ORDER — LACTATED RINGERS IV SOLN
INTRAVENOUS | Status: DC | PRN
  Administered 2015-05-27: 12:00:00 via INTRAVENOUS

## 2015-05-27 MED ORDER — CEFAZOLIN SODIUM-DEXTROSE 2-4 GM/100ML-% IV SOLN
2.0000 g | INTRAVENOUS | Status: DC
  Filled 2015-05-27: qty 100

## 2015-05-27 MED ORDER — SODIUM CHLORIDE 0.9% FLUSH: 3.0000 mL | INTRAVENOUS | Status: DC | PRN

## 2015-05-27 MED ORDER — SIMETHICONE 80 MG PO CHEW
80.0000 mg | CHEWABLE_TABLET | ORAL | Status: DC
  Administered 2015-05-28 – 2015-05-29 (×2): 80 mg via ORAL
  Filled 2015-05-27 (×2): qty 1

## 2015-05-27 MED ORDER — OXYTOCIN 10 UNIT/ML IJ SOLN
INTRAMUSCULAR | Status: AC
  Filled 2015-05-27: qty 4

## 2015-05-27 MED ORDER — LACTATED RINGERS IV SOLN: INTRAVENOUS | Status: DC

## 2015-05-27 MED ORDER — SIMETHICONE 80 MG PO CHEW: 80.0000 mg | CHEWABLE_TABLET | ORAL | Status: DC | PRN

## 2015-05-27 MED ORDER — LANOLIN HYDROUS EX OINT: 1.0000 "application " | TOPICAL_OINTMENT | CUTANEOUS | Status: DC | PRN

## 2015-05-27 MED ORDER — ONDANSETRON HCL 4 MG/2ML IJ SOLN
4.0000 mg | Freq: Three times a day (TID) | INTRAMUSCULAR | Status: DC | PRN
  Administered 2015-05-27: 4 mg via INTRAVENOUS
  Filled 2015-05-27: qty 2

## 2015-05-27 MED ORDER — MENTHOL 3 MG MT LOZG
1.0000 | LOZENGE | OROMUCOSAL | Status: DC | PRN
Start: 2015-05-27 — End: 2015-05-29

## 2015-05-27 MED ORDER — SENNOSIDES-DOCUSATE SODIUM 8.6-50 MG PO TABS
2.0000 | ORAL_TABLET | ORAL | Status: DC
  Administered 2015-05-28 – 2015-05-29 (×2): 2 via ORAL
  Filled 2015-05-27 (×2): qty 2

## 2015-05-27 MED ORDER — OXYCODONE HCL 5 MG PO TABS
5.0000 mg | ORAL_TABLET | ORAL | Status: DC | PRN
  Administered 2015-05-27 – 2015-05-28 (×3): 5 mg via ORAL
  Filled 2015-05-27 (×3): qty 1

## 2015-05-27 MED ORDER — NALBUPHINE HCL 10 MG/ML IJ SOLN
5.0000 mg | Freq: Once | INTRAMUSCULAR | Status: AC | PRN
Start: 2015-05-27 — End: 2015-05-28

## 2015-05-27 MED ORDER — KETOROLAC TROMETHAMINE 30 MG/ML IJ SOLN
INTRAMUSCULAR | Status: AC
  Filled 2015-05-27: qty 1

## 2015-05-27 MED ORDER — BUPIVACAINE HCL (PF) 0.5 % IJ SOLN
INTRAMUSCULAR | Status: AC
  Filled 2015-05-27: qty 30

## 2015-05-27 MED ORDER — ZOLPIDEM TARTRATE 5 MG PO TABS
5.0000 mg | ORAL_TABLET | Freq: Every evening | ORAL | Status: DC | PRN
Start: 2015-05-27 — End: 2015-05-29

## 2015-05-27 MED ORDER — NALOXONE HCL 0.4 MG/ML IJ SOLN: 0.4000 mg | INTRAMUSCULAR | Status: DC | PRN

## 2015-05-27 MED ORDER — DIPHENHYDRAMINE HCL 50 MG/ML IJ SOLN: 12.5000 mg | INTRAMUSCULAR | Status: DC | PRN

## 2015-05-27 MED ORDER — FENTANYL CITRATE (PF) 100 MCG/2ML IJ SOLN
INTRAMUSCULAR | Status: AC
  Filled 2015-05-27: qty 2

## 2015-05-27 MED ORDER — WITCH HAZEL-GLYCERIN EX PADS: 1.0000 "application " | MEDICATED_PAD | CUTANEOUS | Status: DC | PRN

## 2015-05-27 MED ORDER — MORPHINE SULFATE (PF) 0.5 MG/ML IJ SOLN
INTRAMUSCULAR | Status: AC
  Filled 2015-05-27: qty 10

## 2015-05-27 MED ORDER — ONDANSETRON HCL 4 MG/2ML IJ SOLN
INTRAMUSCULAR | Status: AC
  Filled 2015-05-27: qty 2

## 2015-05-27 MED ORDER — OXYTOCIN 10 UNIT/ML IJ SOLN: 2.5000 [IU]/h | INTRAVENOUS | Status: AC

## 2015-05-27 MED ORDER — IBUPROFEN 600 MG PO TABS
600.0000 mg | ORAL_TABLET | Freq: Four times a day (QID) | ORAL | Status: DC
Start: 2015-05-27 — End: 2015-05-29
  Administered 2015-05-28 – 2015-05-29 (×6): 600 mg via ORAL
  Filled 2015-05-27 (×6): qty 1

## 2015-05-27 MED ORDER — OXYTOCIN 10 UNIT/ML IJ SOLN
40.0000 [IU] | INTRAVENOUS | Status: DC | PRN
  Administered 2015-05-27: 40 [IU] via INTRAVENOUS

## 2015-05-27 MED ORDER — SIMETHICONE 80 MG PO CHEW
80.0000 mg | CHEWABLE_TABLET | Freq: Three times a day (TID) | ORAL | Status: DC
  Administered 2015-05-28 – 2015-05-29 (×4): 80 mg via ORAL
  Filled 2015-05-27 (×5): qty 1

## 2015-05-27 MED ORDER — SODIUM CHLORIDE 0.9 % IR SOLN
Status: DC | PRN
  Administered 2015-05-27: 1000 mL

## 2015-05-27 MED ORDER — KETOROLAC TROMETHAMINE 30 MG/ML IJ SOLN
30.0000 mg | Freq: Four times a day (QID) | INTRAMUSCULAR | Status: AC | PRN
  Administered 2015-05-27: 30 mg via INTRAVENOUS
  Filled 2015-05-27: qty 1

## 2015-05-27 MED ORDER — ACETAMINOPHEN 500 MG PO TABS
1000.0000 mg | ORAL_TABLET | Freq: Once | ORAL | Status: AC
  Administered 2015-05-27: 1000 mg via ORAL
  Filled 2015-05-27: qty 2

## 2015-05-27 MED ORDER — MEPERIDINE HCL 25 MG/ML IJ SOLN: 6.2500 mg | INTRAMUSCULAR | Status: DC | PRN

## 2015-05-27 MED ORDER — DIPHENHYDRAMINE HCL 25 MG PO CAPS: 25.0000 mg | ORAL_CAPSULE | Freq: Four times a day (QID) | ORAL | Status: DC | PRN

## 2015-05-27 MED ORDER — NALBUPHINE HCL 10 MG/ML IJ SOLN: 5.0000 mg | INTRAMUSCULAR | Status: DC | PRN

## 2015-05-27 MED ORDER — OXYCODONE HCL 5 MG PO TABS
10.0000 mg | ORAL_TABLET | ORAL | Status: DC | PRN
  Administered 2015-05-28 – 2015-05-29 (×4): 10 mg via ORAL
  Filled 2015-05-27 (×4): qty 2

## 2015-05-27 MED ORDER — BUPIVACAINE HCL (PF) 0.5 % IJ SOLN
INTRAMUSCULAR | Status: DC | PRN
  Administered 2015-05-27: 30 mL

## 2015-05-27 MED ORDER — PHENYLEPHRINE 8 MG IN D5W 100 ML (0.08MG/ML) PREMIX OPTIME
INJECTION | INTRAVENOUS | Status: AC
  Filled 2015-05-27: qty 100

## 2015-05-27 MED ORDER — NALBUPHINE HCL 10 MG/ML IJ SOLN
5.0000 mg | INTRAMUSCULAR | Status: DC | PRN
  Administered 2015-05-27 – 2015-05-28 (×2): 5 mg via SUBCUTANEOUS
  Filled 2015-05-27 (×3): qty 1

## 2015-05-27 MED ORDER — SCOPOLAMINE 1 MG/3DAYS TD PT72
MEDICATED_PATCH | TRANSDERMAL | Status: DC | PRN
  Administered 2015-05-27: 1 via TRANSDERMAL

## 2015-05-27 MED ORDER — DIBUCAINE 1 % RE OINT: 1.0000 "application " | TOPICAL_OINTMENT | RECTAL | Status: DC | PRN

## 2015-05-27 MED ORDER — PHENYLEPHRINE 8 MG IN D5W 100 ML (0.08MG/ML) PREMIX OPTIME
INJECTION | INTRAVENOUS | Status: DC | PRN
  Administered 2015-05-27: 60 ug/min via INTRAVENOUS

## 2015-05-27 MED ORDER — MORPHINE SULFATE (PF) 0.5 MG/ML IJ SOLN
INTRAMUSCULAR | Status: DC | PRN
  Administered 2015-05-27: 4.8 mg via INTRAVENOUS
  Administered 2015-05-27: .2 mg via EPIDURAL

## 2015-05-27 MED ORDER — CEFAZOLIN SODIUM-DEXTROSE 2-3 GM-% IV SOLR
INTRAVENOUS | Status: DC | PRN
  Administered 2015-05-27: 2 g via INTRAVENOUS

## 2015-05-27 MED ORDER — TETANUS-DIPHTH-ACELL PERTUSSIS 5-2.5-18.5 LF-MCG/0.5 IM SUSP
0.5000 mL | Freq: Once | INTRAMUSCULAR | Status: DC
Start: 2015-05-28 — End: 2015-05-29

## 2015-05-27 MED ORDER — NALOXONE HCL 2 MG/2ML IJ SOSY
1.0000 ug/kg/h | PREFILLED_SYRINGE | INTRAVENOUS | Status: DC | PRN
  Filled 2015-05-27: qty 2

## 2015-05-27 MED ORDER — NALBUPHINE HCL 10 MG/ML IJ SOLN
5.0000 mg | Freq: Once | INTRAMUSCULAR | Status: AC | PRN
  Administered 2015-05-28: 5 mg via SUBCUTANEOUS

## 2015-05-27 MED ORDER — SCOPOLAMINE 1 MG/3DAYS TD PT72
MEDICATED_PATCH | TRANSDERMAL | Status: AC
  Filled 2015-05-27: qty 1

## 2015-05-27 MED ORDER — ALBUTEROL SULFATE (2.5 MG/3ML) 0.083% IN NEBU: 3.0000 mL | INHALATION_SOLUTION | Freq: Four times a day (QID) | RESPIRATORY_TRACT | Status: DC | PRN

## 2015-05-27 MED ORDER — SCOPOLAMINE 1 MG/3DAYS TD PT72: 1.0000 | MEDICATED_PATCH | Freq: Once | TRANSDERMAL | Status: DC

## 2015-05-27 MED ORDER — FENTANYL CITRATE (PF) 100 MCG/2ML IJ SOLN
INTRAMUSCULAR | Status: DC | PRN
  Administered 2015-05-27: 10 ug via INTRATHECAL
  Administered 2015-05-27: 40 ug via INTRAVENOUS
  Administered 2015-05-27: 50 ug via INTRAVENOUS

## 2015-05-27 MED ORDER — KETOROLAC TROMETHAMINE 30 MG/ML IJ SOLN
30.0000 mg | Freq: Four times a day (QID) | INTRAMUSCULAR | Status: AC | PRN
  Administered 2015-05-27: 30 mg via INTRAMUSCULAR

## 2015-05-27 MED ORDER — PRENATAL MULTIVITAMIN CH
1.0000 | ORAL_TABLET | Freq: Every day | ORAL | Status: DC
  Administered 2015-05-28 – 2015-05-29 (×2): 1 via ORAL
  Filled 2015-05-27 (×2): qty 1

## 2015-05-27 MED ORDER — DIPHENHYDRAMINE HCL 25 MG PO CAPS
25.0000 mg | ORAL_CAPSULE | ORAL | Status: DC | PRN
  Filled 2015-05-27: qty 1

## 2015-05-27 MED ORDER — BUPIVACAINE IN DEXTROSE 0.75-8.25 % IT SOLN
INTRATHECAL | Status: DC | PRN
  Administered 2015-05-27: 1.6 mL via INTRATHECAL

## 2015-05-27 MED ORDER — CEFAZOLIN SODIUM-DEXTROSE 2-4 GM/100ML-% IV SOLN
INTRAVENOUS | Status: AC
  Filled 2015-05-27: qty 100

## 2015-05-27 SURGICAL SUPPLY — 36 items
BENZOIN TINCTURE PRP APPL 2/3 (GAUZE/BANDAGES/DRESSINGS) ×3 IMPLANT
CHLORAPREP W/TINT 26ML (MISCELLANEOUS) ×3 IMPLANT
CLAMP CORD UMBIL (MISCELLANEOUS) IMPLANT
CLOSURE WOUND 1/2 X4 (GAUZE/BANDAGES/DRESSINGS) ×1
CLOTH BEACON ORANGE TIMEOUT ST (SAFETY) ×3 IMPLANT
DECANTER SPIKE VIAL GLASS SM (MISCELLANEOUS) ×3 IMPLANT
DRSG OPSITE POSTOP 4X10 (GAUZE/BANDAGES/DRESSINGS) ×3 IMPLANT
ELECT REM PT RETURN 9FT ADLT (ELECTROSURGICAL) ×3
ELECTRODE REM PT RTRN 9FT ADLT (ELECTROSURGICAL) ×1 IMPLANT
EXTRACTOR VACUUM M CUP 4 TUBE (SUCTIONS) IMPLANT
EXTRACTOR VACUUM M CUP 4' TUBE (SUCTIONS)
GLOVE BIOGEL PI IND STRL 7.0 (GLOVE) ×3 IMPLANT
GLOVE BIOGEL PI INDICATOR 7.0 (GLOVE) ×6
GLOVE ECLIPSE 7.0 STRL STRAW (GLOVE) ×3 IMPLANT
GOWN STRL REUS W/TWL LRG LVL3 (GOWN DISPOSABLE) ×6 IMPLANT
KIT ABG SYR 3ML LUER SLIP (SYRINGE) IMPLANT
NEEDLE HYPO 22GX1.5 SAFETY (NEEDLE) ×3 IMPLANT
NEEDLE HYPO 25X5/8 SAFETYGLIDE (NEEDLE) ×3 IMPLANT
NS IRRIG 1000ML POUR BTL (IV SOLUTION) ×3 IMPLANT
PACK C SECTION WH (CUSTOM PROCEDURE TRAY) ×3 IMPLANT
PAD ABD 7.5X8 STRL (GAUZE/BANDAGES/DRESSINGS) ×3 IMPLANT
PAD ABD 8X10 STRL (GAUZE/BANDAGES/DRESSINGS) ×3 IMPLANT
PAD OB MATERNITY 4.3X12.25 (PERSONAL CARE ITEMS) ×3 IMPLANT
PENCIL SMOKE EVAC W/HOLSTER (ELECTROSURGICAL) ×3 IMPLANT
RTRCTR C-SECT PINK 25CM LRG (MISCELLANEOUS) IMPLANT
SPONGE GAUZE 4X4 12PLY STER LF (GAUZE/BANDAGES/DRESSINGS) ×6 IMPLANT
STRIP CLOSURE SKIN 1/2X4 (GAUZE/BANDAGES/DRESSINGS) ×2 IMPLANT
SUT PDS AB 0 CTX 36 PDP370T (SUTURE) ×3 IMPLANT
SUT PLAIN 2 0 XLH (SUTURE) IMPLANT
SUT VIC AB 0 CTX 36 (SUTURE) ×6
SUT VIC AB 0 CTX36XBRD ANBCTRL (SUTURE) ×3 IMPLANT
SUT VIC AB 4-0 KS 27 (SUTURE) ×6 IMPLANT
SYR CONTROL 10ML LL (SYRINGE) ×3 IMPLANT
TAPE CLOTH SURG 4X10 WHT LF (GAUZE/BANDAGES/DRESSINGS) ×3 IMPLANT
TOWEL OR 17X24 6PK STRL BLUE (TOWEL DISPOSABLE) ×3 IMPLANT
TRAY FOLEY CATH SILVER 14FR (SET/KITS/TRAYS/PACK) ×3 IMPLANT

## 2015-05-27 NOTE — Op Note (Signed)
Cesarean Section Operative Report  Erica Harrell  05/26/2015 - 05/27/2015  Indications: Scheduled Proceedure/Maternal Request   Pre-operative Diagnosis: cesarean section, elective repeat.   Post-operative Diagnosis: Same   Surgeon: Surgeon(s) and Role:    * Adam PhenixJames G Arnold, MD - Primary    * Kathrynn RunningNoah Bedford Wataru Mccowen, MD - Resident - Assisting   Attending Attestation: I was present and scrubbed for the entire procedure.   Assistants: Lucretia KernJohn Diehl, medical student  Anesthesia: spinal    Estimated Blood Loss: 600 ml  Total IV Fluids: 2700 ml LR  Urine Output:: 600 ml clear yellow urine  Specimens: none  Findings: Viable female infant in cephalic presentation; Apgars 9/9; weight 4400 g; arterial cord pH not obtained; clear amniotic fluid; intact placenta with three vessel cord; normal uterus, fallopian tubes and ovaries bilaterally. No significant adhesive disease.  Baby condition / location:  Couplet care / Skin to Skin   Complications: no complications  Indications: Erica Harrell is a 20 y.o. [id number] with an IUP 4315w1d presenting for PDIOL with initial plan for TOLAC. Cervix ripened with Foley and cytotec. Patient then changed her mind and elected to proceed with repeat c/s.  The risks, benefits, complications, treatment options, and expected outcomes were discussed with the patient . The patient concurred with the proposed plan, giving informed consent. identified as Erica Harrell and the procedure verified as C-Section Delivery.  Procedure Details:  The patient was taken back to the operative suite where spinal anesthesia was placed.  A time out was held and the above information confirmed.   After induction of anesthesia, the patient was draped and prepped in the usual sterile manner and placed in a dorsal supine position with a leftward tilt. A Pfannenstiel incision was made and carried down through the subcutaneous tissue to the fascia. Fascial incision was made and  sharply extended transversely. The fascia was separated from the underlying rectus tissue superiorly and inferiorly. The peritoneum was identified and bluntly entered and extended longitudinally. Alexis retractor was placed. Uterine window noted. A low transverse uterine incision was made above the window and extended bluntly. Delivered from cephalic presentation with vacuum assistance (no pop-offs) was a viable infant with Apgars and weight as above. The umbilical cord was clamped and cut cord blood was obtained for evaluation. Cord ph was not sent. The placenta was removed Intact and appeared normal. The uterine outline, tubes and ovaries appeared normal. The uterine incision was closed with running locked sutures of 0Vicryl with an imbricating layer of the same.   Hemostasis was observed. The peritoneum was closed with 0 vicryl. The rectus muscles were examined and hemostasis observed. The fascia was then reapproximated with running sutures of 0Vicryl. A total of 30 ml 0.5% Marcaine was injected subcutaneously at the margins of the incision. The skin was closed with 4-0Vicryl.   Instrument, sponge, and needle counts were correct prior the abdominal closure and were correct at the conclusion of the case.     Disposition: PACU - hemodynamically stable.   Maternal Condition: stable       Signed: Cherrie Gauzeoah B WoukMD 05/27/2015 1:05 PM

## 2015-05-27 NOTE — Addendum Note (Signed)
Addendum  created 05/27/15 1925 by Shanon PayorSuzanne M Kalii Chesmore, CRNA   Modules edited: Clinical Notes   Clinical Notes:  File: [id number]

## 2015-05-27 NOTE — Progress Notes (Signed)
LABOR PROGRESS NOTE  Erica Harrell is a 20 y.o. G3P1011 at 2677w1d  admitted for IOL for post dates  Subjective: Patient reports that she is doing well.  No pain.  Not feeling contractions.  No concerns at this time.  Objective: BP 103/57 mmHg  Pulse 78  Temp(Src) 98.3 F (36.8 C) (Oral)  Resp 16  Ht 5\' 3"  (1.6 m)  Wt 207 lb (93.895 kg)  BMI 36.68 kg/m2  LMP 08/12/2014 (Exact Date) or  Filed Vitals:   05/26/15 1950 05/26/15 1952 05/26/15 2145 05/26/15 2324  BP:  121/63 114/66 103/57  Pulse:  100 95 78  Temp: 98.2 F (36.8 C)   98.3 F (36.8 C)  TempSrc: Oral   Oral  Resp:  18 16   Height:      Weight:        Fetal monitoringBaseline: 140 bpm, Variability: Good {> 6 bpm) and Accelerations: Reactive Uterine activity Frequency: irregular   Dilation: 1 Effacement (%): 60 Cervical Position: Anterior Station: Ballotable Presentation: Vertex (Confirmed with bedside U/S) Exam by:: Dr. Alvester MorinNewton  Labs: Lab Results  Component Value Date   WBC 8.8 05/26/2015   HGB 11.2* 05/26/2015   HCT 33.1* 05/26/2015   MCV 87.1 05/26/2015   PLT 194 05/26/2015    Patient Active Problem List   Diagnosis Date Noted  . Post term pregnancy, 41 weeks 05/26/2015    Assessment / Plan: 20 y.o. G3P1011 at 877w1d here for IOL for post dates. TOLAC  Labor: Foley bulb still in Fetal Wellbeing:  Cat 1 Pain Control:  PRN fentanyl IV Anticipated MOD:  SVD, TOLAC  Harlo Fabela, DO 05/27/2015, 12:10 AM

## 2015-05-27 NOTE — Lactation Note (Signed)
This note was copied from a baby's chart. Lactation Consultation Note  Patient Name: Erica Ala DachJoneshia Wohlfarth WUJWJ'XToday's Date: 05/27/2015 Reason for consult: Initial assessment Baby at 4 hr of life. Mom requested bf and formula. Discussed risks of formula and slow flow nipple but mom wants to use both. Baby does latch well, encouraged mom to offer both breast at least 10 minutes each then f/u with formula as needed. Discussed risks of pacifiers. Mom bf her older child 1 month because "it felt funny when the baby's gums toucher her nipple". Demonstrated manual expression, colostrum noted bilaterally, spoon in room. Mom stated that she does not want to manually express for baby. Discussed baby behavior, feeding frequency, baby belly size, voids, wt loss, breast changes, and nipple care. Given lactation handouts. Aware of OP services and support group.      Maternal Data Has patient been taught Hand Expression?: Yes Does the patient have breastfeeding experience prior to this delivery?: Yes  Feeding Feeding Type: Breast Fed Nipple Type: Slow - flow Length of feed: 3 min  LATCH Score/Interventions Latch: Grasps breast easily, tongue down, lips flanged, rhythmical sucking.  Audible Swallowing: Spontaneous and intermittent  Type of Nipple: Everted at rest and after stimulation  Comfort (Breast/Nipple): Soft / non-tender     Hold (Positioning): Assistance needed to correctly position infant at breast and maintain latch. Intervention(s): Support Pillows;Position options  LATCH Score: 9  Lactation Tools Discussed/Used WIC Program: Yes   Consult Status Consult Status: Follow-up Date: 05/28/15 Follow-up type: In-patient    Erica Harrell 05/27/2015, 4:40 PM

## 2015-05-27 NOTE — Progress Notes (Signed)
LABOR PROGRESS NOTE  Erica Harrell is a 20 y.o. G3P1011 at 4832w1d  admitted for IOL for post dates  Subjective: Patient reports that she is doing ok.  FB out but cervix only 1.5cm and baby remains high.  She is asking for a repeat c-section.  She does not wish to proceed with TOLAC.  She understands that c-section will be performed later this morning vs afternoon.  No other concerns at this time.  Objective: BP 103/57 mmHg  Pulse 78  Temp(Src) 98.2 F (36.8 C) (Oral)  Resp 18  Ht 5\' 3"  (1.6 m)  Wt 207 lb (93.895 kg)  BMI 36.68 kg/m2  LMP 08/12/2014 (Exact Date) or  Filed Vitals:   05/26/15 1952 05/26/15 2145 05/26/15 2324 05/27/15 0105  BP: 121/63 114/66 103/57   Pulse: 100 95 78   Temp:   98.3 F (36.8 C) 98.2 F (36.8 C)  TempSrc:   Oral Oral  Resp: 18 16  18   Height:      Weight:        Fetal monitoringBaseline: 140 bpm, Variability: Good {> 6 bpm) and Accelerations: Reactive Uterine activity Frequency: irregular   Dilation: 1.5 Effacement (%): 60 Cervical Position: Middle Station: Ballotable Presentation: Undeterminable Exam by:: N Deal RN  Labs: Lab Results  Component Value Date   WBC 8.8 05/26/2015   HGB 11.2* 05/26/2015   HCT 33.1* 05/26/2015   MCV 87.1 05/26/2015   PLT 194 05/26/2015    Patient Active Problem List   Diagnosis Date Noted  . Post term pregnancy, 41 weeks 05/26/2015    Assessment / Plan: 20 y.o. G3P1011 at 3732w1d here for IOL for post dates. Does not wish to proceed with TOLAC.  Dr Newton/day team to schedule for c-section today.  Labor: Foley out. Fetal Wellbeing:  Cat 1 Pain Control:  PRN fentanyl IV Anticipated MOD:  Repeat c-section  Delynn FlavinAshly Cindi Ghazarian, DO 05/27/2015, 1:37 AM

## 2015-05-27 NOTE — Anesthesia Procedure Notes (Signed)
Spinal Patient location during procedure: OR Staffing Anesthesiologist: Marcene DuosFITZGERALD, Taaj Hurlbut Performed by: anesthesiologist  Preanesthetic Checklist Completed: patient identified, site marked, surgical consent, pre-op evaluation, timeout performed, IV checked, risks and benefits discussed and monitors and equipment checked Spinal Block Patient position: sitting Prep: site prepped and draped and DuraPrep Patient monitoring: continuous pulse ox, blood pressure and heart rate Approach: midline Location: L4-5 Injection technique: single-shot Needle Needle type: Pencan  Needle gauge: 24 G Needle length: 9 cm Assessment Sensory level: T6

## 2015-05-27 NOTE — Transfer of Care (Signed)
Immediate Anesthesia Transfer of Care Note  Patient: Erica Harrell  Procedure(s) Performed: Procedure(s): CESAREAN SECTION (N/A)  Patient Location: PACU  Anesthesia Type:Spinal  Level of Consciousness: awake and alert   Airway & Oxygen Therapy: Patient Spontanous Breathing  Post-op Assessment: Report given to RN  Post vital signs: Reviewed  Last Vitals:  Filed Vitals:   05/27/15 1315 05/27/15 1330  BP: 116/75 117/78  Pulse: 72 71  Temp:    Resp: 17 17    Complications: No apparent anesthesia complications

## 2015-05-27 NOTE — Anesthesia Postprocedure Evaluation (Signed)
Anesthesia Post Note  Patient: Erica Harrell  Procedure(s) Performed: Procedure(s) (LRB): CESAREAN SECTION (N/A)  Patient location during evaluation: Mother Baby Anesthesia Type: Spinal Level of consciousness: awake and alert and oriented Pain management: satisfactory to patient Vital Signs Assessment: post-procedure vital signs reviewed and stable Respiratory status: spontaneous breathing and nonlabored ventilation Cardiovascular status: stable Postop Assessment: no headache, no backache, patient able to bend at knees, no signs of nausea or vomiting and adequate PO intake Anesthetic complications: no    Last Vitals:  Filed Vitals:   05/27/15 1615 05/27/15 1715  BP: 98/58 105/72  Pulse: 89 78  Temp: 36.8 C 36.9 C  Resp: 18 18    Last Pain:  Filed Vitals:   05/27/15 1841  PainSc: 8                  Linzie Criss

## 2015-05-27 NOTE — Anesthesia Postprocedure Evaluation (Signed)
Anesthesia Post Note  Patient: Erica Harrell  Procedure(s) Performed: Procedure(s) (LRB): CESAREAN SECTION (N/A)  Patient location during evaluation: PACU Anesthesia Type: Spinal and MAC Level of consciousness: awake and alert Pain management: pain level controlled Vital Signs Assessment: post-procedure vital signs reviewed and stable Respiratory status: spontaneous breathing and respiratory function stable Cardiovascular status: blood pressure returned to baseline and stable Postop Assessment: spinal receding Anesthetic complications: no    Last Vitals:  Filed Vitals:   05/27/15 1415 05/27/15 1500  BP: 124/73 97/58  Pulse: 74 73  Temp: 36.8 C 37 C  Resp: 18     Last Pain:  Filed Vitals:   05/27/15 1539  PainSc: 8                  Kennieth RadFitzgerald, Alaa Eyerman E

## 2015-05-27 NOTE — Progress Notes (Signed)
Patient requests repeat cesarean section, discontinued TOLAC The risks of cesarean section discussed with the patient included but were not limited to: bleeding which may require transfusion or reoperation; infection which may require antibiotics; injury to bowel, bladder, ureters or other surrounding organs; injury to the fetus; need for additional procedures including hysterectomy in the event of a life-threatening hemorrhage; placental abnormalities wth subsequent pregnancies, incisional problems, thromboembolic phenomenon and other postoperative/anesthesia complications. The patient concurred with the proposed plan, giving informed written consent for the procedure.   Patient has been NPO <12 hrs and will remain NPO for procedure. Anesthesia and OR aware. Preoperative prophylactic antibiotics and SCDs ordered on call to the OR.  To OR when ready.  Adam PhenixJames G Arnold, MD 05/27/2015 11:28 AM

## 2015-05-28 ENCOUNTER — Encounter (HOSPITAL_COMMUNITY): Payer: Self-pay | Admitting: Obstetrics & Gynecology

## 2015-05-28 LAB — CBC
HEMATOCRIT: 29.7 % — AB (ref 36.0–46.0)
HEMOGLOBIN: 10.1 g/dL — AB (ref 12.0–15.0)
MCH: 29.5 pg (ref 26.0–34.0)
MCHC: 34 g/dL (ref 30.0–36.0)
MCV: 86.8 fL (ref 78.0–100.0)
Platelets: 159 10*3/uL (ref 150–400)
RBC: 3.42 MIL/uL — AB (ref 3.87–5.11)
RDW: 13.2 % (ref 11.5–15.5)
WBC: 9 10*3/uL (ref 4.0–10.5)

## 2015-05-28 LAB — BIRTH TISSUE RECOVERY COLLECTION (PLACENTA DONATION)

## 2015-05-28 MED ORDER — METHYLPREDNISOLONE SODIUM SUCC 125 MG IJ SOLR
INTRAMUSCULAR | Status: AC
  Filled 2015-05-28: qty 2

## 2015-05-28 NOTE — Progress Notes (Signed)
POSTPARTUM PROGRESS NOTE  Post Operative Day 1 Subjective:  Erica Harrell is a 20 y.o. [id number] 8876w1d s/p rLTCS (began TOLAC but opted C/S).  No acute events overnight.  Pt denies problems with ambulating, voiding or po intake.  She denies nausea or vomiting.  Pain is well controlled.  She has had flatus. She has not had bowel movement.  Lochia -reports that nurses told her is was "normal"  Pt plans to bottle feed and use depo as contraception.  Objective: Blood pressure 127/64, pulse 74, temperature 98.1 F (36.7 C), temperature source Oral, resp. rate 18, height 5\' 3"  (1.6 m), weight 93.895 kg (207 lb), last menstrual period 08/12/2014, SpO2 98 %, unknown if currently breastfeeding.   Physical Exam:  General: alert, cooperative and no distress Lochia:normal flow Chest: CTAB Heart: RRR no m/r/g Abdomen: +BS, soft, nontender, with tenderness at incision site DVT Evaluation: No calf swelling or tenderness- SCDs on Extremities: mild edema in lower extremities    Recent Labs  05/26/15 0734 05/28/15 0606  HGB 11.2* 10.1*  HCT 33.1* 29.7*    Assessment/Plan:  ASSESSMENT: Erica Harrell is a 20 y.o. [id number] 5076w1d s/p rLTCS after TOLAC   Plan for discharge tomorrow   LOS: 2 days   Eartha InchJessica Timmi Devora PA-S 05/28/2015, 7:36 AM
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
## 2015-05-28 NOTE — Addendum Note (Signed)
Addendum  created 05/28/15 1351 by Randa SpikeMyra D Teshara Moree, CRNA   Modules edited: Charges VN

## 2015-05-28 NOTE — Lactation Note (Signed)
This note was copied from a baby's chart. Lactation Consultation Note  Patient Name: Girl Ala DachJoneshia Pai ZOXWR'UToday's Date: 05/28/2015 Reason for consult: Follow[facility]up assessment [facility]formula [facility]. Declined any LC assist.   Maternal Data    Feeding    LATCH Score/Interventions                      Lactation Tools Discussed/Used     Consult Status Consult Status: Complete Date: 05/28/15 Follow-up type: In-patient    Alfred LevinsGranger, Ellason Segar Ann 05/28/2015, 1:45 PM

## 2015-05-28 NOTE — Progress Notes (Signed)
[name] requested MSW intern to come back because she had a question about the process once the CPS report is filed. [name] voiced concerns about her grandmother being upset about other people coming into the home and she wanted to know if she should tell her and how soon. MSW intern explained to [name] the report had not been made yet and was unsure of the time it would take for [facility] to [facility] or if the case would be accepted. [name] was okay with the information provided and requested to be updated as soon as MSW intern had further information.

## 2015-05-29 MED ORDER — MEDROXYPROGESTERONE ACETATE 150 MG/ML IM SUSP
150.0000 mg | Freq: Once | INTRAMUSCULAR | Status: AC
  Administered 2015-05-29: 150 mg via INTRAMUSCULAR
  Filled 2015-05-29: qty 1

## 2015-05-29 MED ORDER — OXYCODONE HCL 5 MG PO TABS: 5.0000 mg | ORAL_TABLET | ORAL | Status: DC | PRN

## 2015-05-29 MED ORDER — IBUPROFEN 600 MG PO TABS: 600.0000 mg | ORAL_TABLET | Freq: Four times a day (QID) | ORAL | Status: DC

## 2015-05-29 NOTE — Discharge Summary (Signed)
OB Discharge Summary     Patient Name: Erica Harrell DOB: 08/31/1995 MRN: [id number]  Date of admission: 05/26/2015 Delivering MD: Adam PhenixARNOLD, JAMES G   Date of discharge: 05/29/2015  Admitting diagnosis: INDUCTION Intrauterine pregnancy: 7858w1d     Secondary diagnosis:  Active Problems:   Post term pregnancy, 41 weeks  Additional problems: stopped TOLAC and opted for RLTCS     Discharge diagnosis: Term Pregnancy Delivered                                                                                                Post partum procedures:none  Augmentation: cytotec  Complications: None  Hospital course:  Induction of Labor With Cesarean Section  20 y.o. yo [id number] at 1758w1d was admitted to the hospital 05/26/2015 for induction of labor. Patient had a labor course significant for nothing. The patient went for cesarean section due to Elective Repeat, and delivered a Viable infant,@BABYSUPPRESS (DBLINK,ept,110,,1,,) Membrane Rupture Time/Date: )12:04 PM ,05/27/2015   @Details  of operation can be found in separate operative Note.  Patient had an uncomplicated postpartum course. She is ambulating, tolerating a regular diet, passing flatus, and urinating well.  Patient is discharged home in stable condition on 05/29/2015.                                     Physical exam  Filed Vitals:   05/28/15 0101 05/28/15 0530 05/28/15 1900 05/29/15 0543  BP: 118/54 127/64 99/54 105/65  Pulse: 76 74 75 75  Temp: 98.1 F (36.7 C)  98.6 F (37 C) 98.5 F (36.9 C)  TempSrc:   Oral Oral  Resp: 18 18 18 18   Height:      Weight:      SpO2: 98%      General: alert, cooperative and no distress Lochia: appropriate Uterine Fundus: firm Incision: Dressing is clean, dry, and intact DVT Evaluation: No evidence of DVT seen on physical exam. Labs: Lab Results  Component Value Date   WBC 9.0 05/28/2015   HGB 10.1* 05/28/2015   HCT 29.7* 05/28/2015   MCV 86.8 05/28/2015   PLT 159 05/28/2015   CMP  Latest Ref Rng 01/14/2015  Glucose 65 - 99 mg/dL 96  BUN 6 - 20 mg/dL 8  Creatinine 3.080.44 - 6.571.00 mg/dL 8.460.48  Sodium 962135 - 952145 mmol/L 135  Potassium 3.5 - 5.1 mmol/L 3.8  Chloride 101 - 111 mmol/L 104  CO2 22 - 32 mmol/L 25  Calcium 8.9 - 10.3 mg/dL 9.1  Total Protein 6.5 - 8.1 g/dL 6.6  Total Bilirubin 0.3 - 1.2 mg/dL 0.3  Alkaline Phos 38 - 126 U/L 81  AST 15 - 41 U/L 14(L)  ALT 14 - 54 U/L 13(L)    Discharge instruction: per After Visit Summary and "Baby and Me Booklet".  After visit meds:    Medication List    STOP taking these medications        albuterol 108 (90 Base) MCG/ACT inhaler  Commonly known as:  PROVENTIL HFA;VENTOLIN HFA  prenatal multivitamin Tabs tablet      TAKE these medications        ibuprofen 600 MG tablet  Commonly known as:  ADVIL,MOTRIN  Take 1 tablet (600 mg total) by mouth every 6 (six) hours.     oxyCODONE 5 MG immediate release tablet  Commonly known as:  Oxy IR/ROXICODONE  Take 1 tablet (5 mg total) by mouth every 4 (four) hours as needed (pain scale 4-7).        Diet: routine diet  Activity: Advance as tolerated. Pelvic rest for 6 weeks.   Outpatient follow up:4 weeks Follow up Appt:No future appointments. Follow up Visit:No Follow-up on file.  Postpartum contraception: Depo Provera  Newborn Data: Live born female  Birth Weight: 9 lb 11.2 oz (4400 g) APGAR: 9, 9  Baby Feeding: Bottle Disposition:home with mother   05/29/2015 Greig Right, CNM

## 2015-05-29 NOTE — Lactation Note (Signed)
This note was copied from a baby's chart. Lactation Consultation Note  Patient Name: Erica Ala DachJoneshia Siefken RUEAV'WToday's Date: 05/29/2015 Reason for consult: Follow-up assessment Peer RN, Mom declines LC services.   Maternal Data    Feeding    LATCH Score/Interventions                      Lactation Tools Discussed/Used     Consult Status Consult Status: Complete Date: 05/29/15 Follow-up type: In-patient    Alfred LevinsGranger, [name] 05/29/2015, 10:39 AM

## 2015-05-29 NOTE — Progress Notes (Signed)
[facility] that the report that was made be MSW Intern was screened out.  No barriers to discharge.

## 2015-05-29 NOTE — Discharge Instructions (Signed)
Cesarean Delivery, Care After  Refer to this sheet in the next few weeks. These instructions provide you with information on caring for yourself after your procedure. Your health care provider may also give you specific instructions. Your treatment has been planned according to current medical practices, but problems sometimes occur. Call your health care provider if you have any problems or questions after you go home.  HOME CARE INSTRUCTIONS   Only take over-the-counter or prescription medications as directed by your health care provider.   Do not drink alcohol, especially if you are breastfeeding or taking medication to relieve pain.   Do not chew or smoke tobacco.   Continue to use good perineal care. Good perineal care includes:    Wiping your perineum from front to back.    Keeping your perineum clean.   Check your surgical cut (incision) daily for increased redness, drainage, swelling, or separation of skin.   Clean your incision gently with soap and water every day, and then pat it dry. If your health care provider says it is okay, leave the incision uncovered. Use a bandage (dressing) if the incision is draining fluid or appears irritated. If the adhesive strips across the incision do not fall off within 7 days, carefully peel them off.   Hug a pillow when coughing or sneezing until your incision is healed. This helps to relieve pain.   Do not use tampons or douche until your health care provider says it is okay.   Shower, wash your hair, and take tub baths as directed by your health care provider.   Wear a well-fitting bra that provides breast support.   Limit wearing support panties or control-top hose.   Drink enough fluids to keep your urine clear or pale yellow.   Eat high-fiber foods such as whole grain cereals and breads, brown rice, beans, and fresh fruits and vegetables every day. These foods may help prevent or relieve constipation.   Resume activities such as climbing stairs,  driving, lifting, exercising, or traveling as directed by your health care provider.   Talk to your health care provider about resuming sexual activities. This is dependent upon your risk of infection, your rate of healing, and your comfort and desire to resume sexual activity.   Try to have someone help you with your household activities and your newborn for at least a few days after you leave the hospital.   Rest as much as possible. Try to rest or take a nap when your newborn is sleeping.   Increase your activities gradually.   Keep all of your scheduled postpartum appointments. It is very important to keep your scheduled follow-up appointments. At these appointments, your health care provider will be checking to make sure that you are healing physically and emotionally.  SEEK MEDICAL CARE IF:    You are passing large clots from your vagina. Save any clots to show your health care provider.   You have a foul smelling discharge from your vagina.   You have trouble urinating.   You are urinating frequently.   You have pain when you urinate.   You have a change in your bowel movements.   You have increasing redness, pain, or swelling near your incision.   You have pus draining from your incision.   Your incision is separating.   You have painful, hard, or reddened breasts.   You have a severe headache.   You have blurred vision or see spots.   You feel sad   or depressed.   You have thoughts of hurting yourself or your newborn.   You have questions about your care, the care of your newborn, or medications.   You are dizzy or light-headed.   You have a rash.   You have pain, redness, or swelling at the site of the removed intravenous access (IV) tube.   You have nausea or vomiting.   You stopped breastfeeding and have not had a menstrual period within 12 weeks of stopping.   You are not breastfeeding and have not had a menstrual period within 12 weeks of delivery.   You have a fever.  SEEK  IMMEDIATE MEDICAL CARE IF:   You have persistent pain.   You have chest pain.   You have shortness of breath.   You faint.   You have leg pain.   You have stomach pain.   Your vaginal bleeding saturates 2 or more sanitary pads in 1 hour.  MAKE SURE YOU:    Understand these instructions.   Will watch your condition.   Will get help right away if you are not doing well or get worse.     This information is not intended to replace advice given to you by your health care provider. Make sure you discuss any questions you have with your health care provider.     Document Released: 10/30/2001 Document Revised: 02/28/2014 Document Reviewed: 10/05/2011  Elsevier Interactive Patient Education 2016 [employer]

## 2015-09-23 ENCOUNTER — Emergency Department (HOSPITAL_COMMUNITY)
Admission: EM | Admit: 2015-09-23 | Discharge: 2015-09-23 | Disposition: A | Discharge status: Home / Self Care | Payer: Medicaid Other | Attending: Emergency Medicine | Admitting: Emergency Medicine

## 2015-09-23 ENCOUNTER — Encounter (HOSPITAL_COMMUNITY): Payer: Self-pay | Admitting: Neurology

## 2015-09-23 DIAGNOSIS — J45909 Unspecified asthma, uncomplicated: Secondary | ICD-10-CM | POA: Diagnosis not present

## 2015-09-23 DIAGNOSIS — Z3202 Encounter for pregnancy test, result negative: Secondary | ICD-10-CM | POA: Diagnosis present

## 2015-09-23 DIAGNOSIS — Z3200 Encounter for pregnancy test, result unknown: Secondary | ICD-10-CM

## 2015-09-23 LAB — POC URINE PREG, ED: Preg Test, Ur: NEGATIVE

## 2015-09-23 LAB — HCG, QUANTITATIVE, PREGNANCY: hCG, Beta Chain, Quant, S: <1 m[IU]/mL (ref <5)

## 2015-09-23 NOTE — ED Triage Notes (Signed)
Pt here requesting pregnancy test LMP 08/21/15. Thinks she may be pregnant and wants a test. Had baby in April. Denies abdominal pain.

## 2015-09-23 NOTE — ED Provider Notes (Signed)
[facility] Provider Note  By signing my name below, I, Erica Harrell, attest that this documentation has been prepared under the direction and in the presence of Treatment Team:  Attending Provider: Gwyneth Sprout, MD Physician Assistant: Emi Holes, PA-C.  Electronically Signed: Arvilla Market, Medical Scribe. 09/23/15. 2:52 PM.  CSN: [id number] Arrival date & time: 09/23/15  1054  First Provider Contact:  None  History   Chief Complaint Chief Complaint  Patient presents with  . Possible Pregnancy    HPI Comments: Erica Harrell is a 20 y.o. female who presents to the Emergency Department complaining of possible pregnancy. Pt's LMP was 08/21/15 and she's normally on time. Pt took a home pregnancy test and had "faint" results. Pt was on depo for birth control but she missed her shot for July 5th. Pt just had a child and she is not breastfeeding. She denies suprapubic pain, trouble breathing, fevers, chills, abdominal pain, HA, chest pain, back pain, rash, wound, n/v, dysuria, urinary frequency, abnormal vaginal discharge or bleeding.  The history is provided by the patient. No language interpreter was used.    Past Medical History:  Diagnosis Date  . Asthma   . Bacterial vaginosis   . Constipation   . Depression   . Headache(784.0)   . Pelvic inflammatory disease   . Seasonal allergies     Patient Active Problem List   Diagnosis Date Noted  . Post term pregnancy, 41 weeks 05/26/2015    Past Surgical History:  Procedure Laterality Date  . CESAREAN SECTION    . CESAREAN SECTION N/A 05/27/2015   Procedure: CESAREAN SECTION;  Surgeon: Adam Phenix, MD;  Location: [facility];  Service: Obstetrics;  Laterality: N/A;  . VAGINAL DELIVERY      OB History    Gravida Para Term Preterm AB Living   3 2 2   1 2    SAB TAB Ectopic Multiple Live Births   1     0        Obstetric Comments   Elective C/S       Home Medications    Prior to Admission medications    Medication Sig Start Date End Date Taking? Authorizing Provider  ibuprofen (ADVIL,MOTRIN) 600 MG tablet Take 1 tablet (600 mg total) by mouth every 6 (six) hours. 05/29/15   Jacklyn Shell, CNM  oxyCODONE (OXY IR/ROXICODONE) 5 MG immediate release tablet Take 1 tablet (5 mg total) by mouth every 4 (four) hours as needed (pain scale 4-7). 05/29/15   Jacklyn Shell, CNM    Family History Family History  Problem Relation Age of Onset  . Asthma Mother   . Diabetes Maternal Aunt   . Diabetes Maternal Uncle   . Hypertension Maternal Grandmother   . Diabetes Maternal Grandmother   . Asthma Maternal Grandmother     Social History Social History  Substance Use Topics  . Smoking status: Never Smoker  . Smokeless tobacco: Never Used  . Alcohol use No     Allergies   Shellfish allergy and Zoloft [sertraline hcl]   Review of Systems Review of Systems  Constitutional: Negative for chills and fever.  HENT: Negative for facial swelling.   Respiratory: Negative for shortness of breath.   Cardiovascular: Negative for chest pain.  Gastrointestinal: Negative for abdominal pain, nausea and vomiting.  Genitourinary: Negative for dysuria, frequency, pelvic pain, vaginal bleeding, vaginal discharge and vaginal pain.  Skin: Negative for rash and wound.  Psychiatric/Behavioral: The patient is not nervous/anxious.  Physical Exam Updated Vital Signs BP 125/93 (BP Location: Right Arm)   Pulse 77   Temp 98.7 F (37.1 C) (Oral)   Resp 16   LMP 08/21/2015 (Exact Date)   SpO2 100%   Physical Exam  Constitutional: She appears well-developed and well-nourished. No distress.  HENT:  Head: Normocephalic and atraumatic.  Mouth/Throat: Oropharynx is clear and moist. No oropharyngeal exudate.  Eyes: Conjunctivae are normal. Pupils are equal, round, and reactive to light. Right eye exhibits no discharge. Left eye exhibits no discharge. No scleral icterus.  Neck: Normal range of  motion. Neck supple. No thyromegaly present.  Cardiovascular: Normal rate, regular rhythm, normal heart sounds and intact distal pulses.  Exam reveals no gallop and no friction rub.   No murmur heard. Pulmonary/Chest: Effort normal and breath sounds normal. No stridor. No respiratory distress. She has no wheezes. She has no rales.  Abdominal: Soft. Bowel sounds are normal. She exhibits no distension. There is no tenderness. There is no rebound and no guarding.  Musculoskeletal: She exhibits no edema.  Lymphadenopathy:    She has no cervical adenopathy.  Neurological: She is alert. Coordination normal.  Skin: Skin is warm and dry. No rash noted. She is not diaphoretic. No pallor.  Psychiatric: She has a normal mood and affect.  Nursing note and vitals reviewed.   ED Treatments / Results  Labs (all labs ordered are listed, but only abnormal results are displayed) Labs Reviewed  HCG, QUANTITATIVE, PREGNANCY  POC URINE PREG, ED   DIAGNOSTIC STUDIES: Oxygen Saturation is 100% on RA, nl by my interpretation.    COORDINATION OF CARE: 3:05 PM Discussed treatment plan with pt at bedside and pt agreed to plan.  EKG  EKG Interpretation None       Radiology No results found.  Procedures Procedures (including critical care time)  Medications Ordered in ED Medications - No data to display   Initial Impression / Assessment and Plan / ED Course  I have reviewed the triage vital signs and the nursing notes.  Pertinent labs & imaging results that were available during my care of the patient were reviewed by me and considered in my medical decision making (see chart for details).  Clinical Course      Final Clinical Impressions(s) / ED Diagnoses   Final diagnoses:  Negative pregnancy test  Encounter for pregnancy test   Urine pregnancy negative. HCG Quant <1. LMP 08/21/15. Patient missed Depo-Provera injection that was scheduled for July 5. Patient to follow-up with OB/GYN for  this injection. Patient advised to use alternative contraception until she has this injection if she does not want to become pregnant. Patient is not pregnant according to urine and hCG Quant at this time. Patient understands and agrees with plan. Patient discharged in satisfactory condition. Patient otherwise well and has no further complaints.  New Prescriptions New Prescriptions   No medications on file    I personally performed the services described in this documentation, which was scribed in my presence. The recorded information has been reviewed and is accurate.    Emi Holes, PA-C 09/23/15 1506    Gwyneth Sprout, MD 09/24/15 1036

## 2015-09-23 NOTE — Discharge Instructions (Signed)
Please follow-up with your OB/GYN for your Depo-Provera shot as soon as possible. If you do not want to become pregnant, I recommend you use alternative contraception such as condoms until you get your Depo shot. Please return to the emergency department if you develop any new or worsening symptoms.

## 2015-10-27 ENCOUNTER — Ambulatory Visit (INDEPENDENT_AMBULATORY_CARE_PROVIDER_SITE_OTHER): Payer: Medicaid Other

## 2015-10-27 ENCOUNTER — Inpatient Hospital Stay (HOSPITAL_COMMUNITY)
Admission: AD | Admit: 2015-10-27 | Discharge: 2015-10-27 | Disposition: A | Discharge status: Home / Self Care | Payer: Medicaid Other | Source: Ambulatory Visit | Attending: Family Medicine | Admitting: Family Medicine

## 2015-10-27 ENCOUNTER — Encounter (HOSPITAL_COMMUNITY): Payer: Self-pay | Admitting: *Deleted

## 2015-10-27 DIAGNOSIS — Z3202 Encounter for pregnancy test, result negative: Secondary | ICD-10-CM | POA: Diagnosis not present

## 2015-10-27 DIAGNOSIS — Z711 Person with feared health complaint in whom no diagnosis is made: Secondary | ICD-10-CM | POA: Diagnosis not present

## 2015-10-27 NOTE — MAU Provider Note (Signed)
Erica Harrell is a 20 y.o. female who presents for pregnancy test. Denies abdominal pain or vaginal bleeding; states she just wants to know if she's pregnant. Has not taken pregnancy test at home. [facility]   Judeth HornErin Ashlock, NP

## 2015-10-27 NOTE — Progress Notes (Signed)
Patient presented to office today for a pregnancy test. Test confirms she is not pregnant at this time. Patient verbalizes understanding of results.

## 2015-10-27 NOTE — Discharge Instructions (Signed)
Pregnancy Test Information °WHAT IS A PREGNANCY TEST? °A pregnancy test is used to detect the presence of human chorionic gonadotropin (hCG) in a sample of your urine or blood. hCG is a hormone produced by the cells of the placenta. The placenta is the organ that forms to nourish and support a developing baby. °This test requires a sample of either blood or urine. A pregnancy test determines whether you are pregnant or not. °HOW ARE PREGNANCY TESTS DONE? °Pregnancy tests are done using a home pregnancy test or having a blood or urine test done at your health care provider's office.  °Home pregnancy tests require a urine sample. °· Most kits use a plastic testing device with a strip of paper that indicates whether there is hCG in your urine. °· Follow the test instructions very carefully. °· After you urinate on the test stick, markings will appear to let you know whether you are pregnant. °· For best results, use your first urine of the morning. That is when the concentration of hCG is highest. °Having a blood test to check for pregnancy requires a sample of blood drawn from a vein in your hand or arm. Your health care provider will send your sample to a lab for testing. Results of a pregnancy test will be positive or negative. °IS ONE TYPE OF PREGNANCY TEST BETTER THAN ANOTHER? °In some cases, a blood test will return a positive result even if a urine test was negative because blood tests are more sensitive. This means blood tests can detect hCG earlier than home pregnancy tests.  °HOW ACCURATE ARE HOME PREGNANCY TESTS?  °Both types of pregnancy tests are very accurate. °· A blood test is about 98% accurate. °· When you are far enough along in your pregnancy and when used correctly, home pregnancy tests are equally accurate. °CAN ANYTHING INTERFERE WITH HOME PREGNANCY TEST RESULTS?  °It is possible for certain conditions to cause an inaccurate test result (false positive or false negative). °· A false positive is a  positive test result when you are not pregnant. This can happen if you: °¨ Are taking certain medicines, including anticonvulsants or tranquilizers. °¨ Have certain proteins in your blood. °· A false negative is a negative test result when you are pregnant. This can happen if you: °¨ Took the test before there was enough hCG to detect. A pregnancy test will not be positive in most women until 3-4 weeks after conception. °¨ Drank a lot of liquid before the test. Diluted urine samples can sometimes give an inaccurate result. °¨ Take certain medicines, such as water pills (diuretics) or some antihistamines. °WHAT SHOULD I DO IF I HAVE A POSITIVE PREGNANCY TEST? °If you have a positive pregnancy test, schedule an appointment with your health care provider. You might need additional testing to confirm the pregnancy. In the meantime, begin taking a prenatal vitamin, stop smoking, stop drinking alcohol, and do not use street drugs. °Talk to your health care provider about how to take care of yourself during your pregnancy. Ask about what to expect from the care you will need throughout pregnancy (prenatal care). °  °This information is not intended to replace advice given to you by your health care provider. Make sure you discuss any questions you have with your health care provider. °  °Document Released: 02/10/2003 Document Revised: 02/28/2014 Document Reviewed: 06/04/2013 °Elsevier Interactive Patient Education ©2016 [employer] ° °

## 2015-10-27 NOTE — MAU Note (Signed)
Pt thinks she is pregnant, has not done HPT.  LMP 8/8, had spotting the 22nd & 23rd.  Denies abdominal pain or bleeding today.

## 2015-10-28 LAB — POCT PREGNANCY, URINE: PREG TEST UR: NEGATIVE

## 2015-11-02 ENCOUNTER — Encounter (HOSPITAL_COMMUNITY): Payer: Self-pay | Admitting: *Deleted

## 2015-11-02 ENCOUNTER — Emergency Department (HOSPITAL_COMMUNITY)
Admission: EM | Admit: 2015-11-02 | Discharge: 2015-11-02 | Disposition: A | Discharge status: Home / Self Care | Payer: Medicaid Other | Attending: Emergency Medicine | Admitting: Emergency Medicine

## 2015-11-02 DIAGNOSIS — K047 Periapical abscess without sinus: Secondary | ICD-10-CM | POA: Diagnosis not present

## 2015-11-02 DIAGNOSIS — J45909 Unspecified asthma, uncomplicated: Secondary | ICD-10-CM | POA: Diagnosis not present

## 2015-11-02 DIAGNOSIS — Z79899 Other long term (current) drug therapy: Secondary | ICD-10-CM | POA: Insufficient documentation

## 2015-11-02 DIAGNOSIS — K0889 Other specified disorders of teeth and supporting structures: Secondary | ICD-10-CM

## 2015-11-02 DIAGNOSIS — Z3202 Encounter for pregnancy test, result negative: Secondary | ICD-10-CM | POA: Diagnosis not present

## 2015-11-02 LAB — I-STAT BETA HCG BLOOD, ED (MC, WL, AP ONLY): I-stat hCG, quantitative: <5 m[IU]/mL (ref <5)

## 2015-11-02 MED ORDER — HYDROCODONE-ACETAMINOPHEN 5-325 MG PO TABS
1.0000 | ORAL_TABLET | Freq: Once | ORAL | Status: AC
  Administered 2015-11-02: 1 via ORAL
  Filled 2015-11-02: qty 1

## 2015-11-02 MED ORDER — NAPROXEN 500 MG PO TABS: 500.0000 mg | ORAL_TABLET | Freq: Two times a day (BID) | ORAL | 0 refills | Status: DC

## 2015-11-02 MED ORDER — AMOXICILLIN 500 MG PO CAPS
500.0000 mg | ORAL_CAPSULE | Freq: Once | ORAL | Status: AC
  Administered 2015-11-02: 500 mg via ORAL
  Filled 2015-11-02: qty 1

## 2015-11-02 MED ORDER — AMOXICILLIN 500 MG PO CAPS: 500.0000 mg | ORAL_CAPSULE | Freq: Three times a day (TID) | ORAL | 0 refills | Status: DC

## 2015-11-02 NOTE — ED Provider Notes (Signed)
[facility] Provider Note   CSN: [id number] Arrival date & time: 11/02/15  1944     History   Chief Complaint Chief Complaint  Patient presents with  . Possible Pregnancy  . Dental Pain    HPI Erica Harrell is a 20 y.o. female who presents to the ED with dental pain. The pain is located in the upper wisdom teeth.   Patient was not in the waiting room when first called. She came to the exam room eating chips and drinking pepsi. She does not appear in any acute distress.   HPI  Past Medical History:  Diagnosis Date  . Asthma   . Bacterial vaginosis   . Constipation   . Depression   . Headache(784.0)   . Pelvic inflammatory disease   . Seasonal allergies     Patient Active Problem List   Diagnosis Date Noted  . Post term pregnancy, 41 weeks 05/26/2015    Past Surgical History:  Procedure Laterality Date  . CESAREAN SECTION    . CESAREAN SECTION N/A 05/27/2015   Procedure: CESAREAN SECTION;  Surgeon: Adam Phenix, MD;  Location: [facility];  Service: Obstetrics;  Laterality: N/A;  . VAGINAL DELIVERY      OB History    Gravida Para Term Preterm AB Living   3 2 2   1 2    SAB TAB Ectopic Multiple Live Births   1     0 2      Obstetric Comments   Elective C/S       Home Medications    Prior to Admission medications   Medication Sig Start Date End Date Taking? Authorizing Provider  amoxicillin (AMOXIL) 500 MG capsule Take 1 capsule (500 mg total) by mouth 3 (three) times daily. 11/02/15   Hope Orlene Och, NP  ibuprofen (ADVIL,MOTRIN) 600 MG tablet Take 1 tablet (600 mg total) by mouth every 6 (six) hours. 05/29/15   Jacklyn Shell, CNM  naproxen (NAPROSYN) 500 MG tablet Take 1 tablet (500 mg total) by mouth 2 (two) times daily. 11/02/15   Hope Orlene Och, NP  oxyCODONE (OXY IR/ROXICODONE) 5 MG immediate release tablet Take 1 tablet (5 mg total) by mouth every 4 (four) hours as needed (pain scale 4-7). 05/29/15   Jacklyn Shell, CNM    Family  History Family History  Problem Relation Age of Onset  . Asthma Mother   . Diabetes Maternal Aunt   . Diabetes Maternal Uncle   . Hypertension Maternal Grandmother   . Diabetes Maternal Grandmother   . Asthma Maternal Grandmother     Social History Social History  Substance Use Topics  . Smoking status: Never Smoker  . Smokeless tobacco: Never Used  . Alcohol use No     Allergies   Shellfish allergy and Zoloft [sertraline hcl]   Review of Systems Review of Systems  HENT: Positive for dental problem.   All other systems reviewed and are negative.    Physical Exam Updated Vital Signs BP 114/74 (BP Location: Right Arm)   Pulse 97   Temp 98.6 F (37 C) (Oral)   Resp 18   Ht 5\' 4"  (1.626 m)   Wt 73.9 kg   LMP 09/29/2015   SpO2 100%   BMI 27.98 kg/m   Physical Exam  Constitutional: She is oriented to person, place, and time. She appears well-developed and well-nourished.  HENT:  Head: Normocephalic and atraumatic.  Right Ear: Tympanic membrane normal.  Left Ear: Tympanic membrane normal.  Nose: Nose  normal.  Mouth/Throat: Oropharynx is clear and moist and mucous membranes are normal. No trismus in the jaw. Dental caries present. No uvula swelling.    Bilateral upper third molars with decay.   Eyes: EOM are normal.  Neck: Neck supple.  Pulmonary/Chest: Effort normal.  Abdominal: Soft. There is no tenderness.  Musculoskeletal: Normal range of motion.  Neurological: She is alert and oriented to person, place, and time. No cranial nerve deficit.  Skin: Skin is warm and dry.  Psychiatric: She has a normal mood and affect. Her behavior is normal.  Nursing note and vitals reviewed.    ED Treatments / Results  Labs (all labs ordered are listed, but only abnormal results are displayed) Labs Reviewed  I-STAT BETA HCG BLOOD, ED ([facility], [facility], [facility] ONLY)    Radiology No results found.  Procedures Procedures (including critical care time)  Medications Ordered in  ED Medications  amoxicillin (AMOXIL) capsule 500 mg (not administered)  HYDROcodone-acetaminophen (NORCO/VICODIN) 5-325 MG per tablet 1 tablet (not administered)     Initial Impression / Assessment and Plan / ED Course  I have reviewed the triage vital signs and the nursing notes.   Clinical Course    Final Clinical Impressions(s) / ED Diagnoses  20 y.o. female with dental pain stable for d/c without fever, trismus and no concern for Ludwig's Angina. Dental information given for follow up. Discussed with the patient and all questioned fully answered. She will return if any problems arise.   Final diagnoses:  Dental infection  Pain, dental    New Prescriptions New Prescriptions   AMOXICILLIN (AMOXIL) 500 MG CAPSULE    Take 1 capsule (500 mg total) by mouth 3 (three) times daily.   NAPROXEN (NAPROSYN) 500 MG TABLET    Take 1 tablet (500 mg total) by mouth 2 (two) times daily.     HolcombHope M Neese, NP 11/02/15 2201    Marily MemosJason Mesner, MD 11/03/15 [phone]

## 2015-11-02 NOTE — ED Triage Notes (Signed)
Pt c/o wisdom teeth pain. Pt also states she is 6 days late on her period

## 2015-11-02 NOTE — Discharge Instructions (Signed)
Follow up with a dentist as soon as possible  Your pregnancy test tonight was negative.

## 2015-11-04 ENCOUNTER — Other Ambulatory Visit (HOSPITAL_COMMUNITY): Payer: Self-pay | Admitting: Advanced Practice Midwife

## 2015-11-10 ENCOUNTER — Encounter (HOSPITAL_COMMUNITY): Payer: Self-pay | Admitting: *Deleted

## 2015-11-10 ENCOUNTER — Encounter (HOSPITAL_COMMUNITY): Payer: Self-pay | Admitting: Emergency Medicine

## 2015-11-10 ENCOUNTER — Emergency Department (HOSPITAL_COMMUNITY)
Admission: EM | Admit: 2015-11-10 | Discharge: 2015-11-10 | Disposition: A | Discharge status: Home / Self Care | Payer: Medicaid Other | Source: Home / Self Care

## 2015-11-10 ENCOUNTER — Emergency Department (HOSPITAL_COMMUNITY)
Admission: EM | Admit: 2015-11-10 | Discharge: 2015-11-10 | Disposition: A | Discharge status: Home / Self Care | Payer: Medicaid Other | Attending: Emergency Medicine | Admitting: Emergency Medicine

## 2015-11-10 DIAGNOSIS — N9489 Other specified conditions associated with female genital organs and menstrual cycle: Secondary | ICD-10-CM

## 2015-11-10 DIAGNOSIS — J45909 Unspecified asthma, uncomplicated: Secondary | ICD-10-CM

## 2015-11-10 DIAGNOSIS — B379 Candidiasis, unspecified: Secondary | ICD-10-CM | POA: Diagnosis not present

## 2015-11-10 DIAGNOSIS — N898 Other specified noninflammatory disorders of vagina: Secondary | ICD-10-CM | POA: Insufficient documentation

## 2015-11-10 DIAGNOSIS — N926 Irregular menstruation, unspecified: Secondary | ICD-10-CM | POA: Insufficient documentation

## 2015-11-10 DIAGNOSIS — B373 Candidiasis of vulva and vagina: Secondary | ICD-10-CM | POA: Insufficient documentation

## 2015-11-10 DIAGNOSIS — B3731 Acute candidiasis of vulva and vagina: Secondary | ICD-10-CM

## 2015-11-10 LAB — I-STAT BETA HCG BLOOD, ED (MC, WL, AP ONLY): I-stat hCG, quantitative: <5 m[IU]/mL (ref <5)

## 2015-11-10 LAB — I-STAT CHEM 8, ED
BUN: 10 mg/dL (ref 6–20)
CALCIUM ION: 1.19 mmol/L (ref 1.15–1.40)
CHLORIDE: 106 mmol/L (ref 101–111)
CREATININE: 0.7 mg/dL (ref 0.44–1.00)
GLUCOSE: 85 mg/dL (ref 65–99)
HCT: 44 % (ref 36.0–46.0)
Hemoglobin: 15 g/dL (ref 12.0–15.0)
Potassium: 3.9 mmol/L (ref 3.5–5.1)
Sodium: 140 mmol/L (ref 135–145)
TCO2: 26 mmol/L (ref 0–100)

## 2015-11-10 LAB — WET PREP, GENITAL
Clue Cells Wet Prep HPF POC: NONE SEEN
Sperm: NONE SEEN
Trich, Wet Prep: NONE SEEN

## 2015-11-10 LAB — POC URINE PREG, ED: PREG TEST UR: NEGATIVE

## 2015-11-10 MED ORDER — FLUCONAZOLE 100 MG PO TABS
200.0000 mg | ORAL_TABLET | Freq: Once | ORAL | Status: AC
  Administered 2015-11-10: 200 mg via ORAL
  Filled 2015-11-10: qty 2

## 2015-11-10 NOTE — ED Triage Notes (Signed)
Pt sts increased vaginal bleeding with clots; pt seen last night for same but LWBS due to wait time

## 2015-11-10 NOTE — ED Provider Notes (Signed)
[facility] Provider Note   CSN: [id number] Arrival date & time: 11/10/15  1035     History   Chief Complaint Chief Complaint  Patient presents with  . Vaginal Bleeding    HPI  Blood pressure 121/75, pulse 90, temperature 98.7 F (37.1 C), temperature source Oral, resp. rate 18, last menstrual period 09/29/2015, SpO2 98 %, unknown if currently breastfeeding.  Erica Harrell is a 20 y.o. female complaining of irregular menses onsets in April after she delivered her child, she had a depo shot at that time, she was scheduled for a gap of shot one month ago but missed her appointment, she was scheduled for depo shot today at 11 AM, however, she presents to the emergency department instead because she had concern over vaginal spotting with passage of clots and tissue last night. She doesn't report any bleeding but just spotting. Denies lower abdominal pain, syncope, dysuria, hematuria, urinary frequency. Reports some abnormal vaginal discharge over the last several weeks as well. Urine pregnancy from several hours ago was negative.  HPI  Past Medical History:  Diagnosis Date  . Asthma   . Bacterial vaginosis   . Constipation   . Depression   . Headache(784.0)   . Pelvic inflammatory disease   . Seasonal allergies     Patient Active Problem List   Diagnosis Date Noted  . Post term pregnancy, 41 weeks 05/26/2015    Past Surgical History:  Procedure Laterality Date  . CESAREAN SECTION    . CESAREAN SECTION N/A 05/27/2015   Procedure: CESAREAN SECTION;  Surgeon: Adam PhenixJames G Arnold, MD;  Location: [facility];  Service: Obstetrics;  Laterality: N/A;  . VAGINAL DELIVERY      OB History    Gravida Para Term Preterm AB Living   3 2 2   1 2    SAB TAB Ectopic Multiple Live Births   1     0 2      Obstetric Comments   Elective C/S       Home Medications    Prior to Admission medications   Medication Sig Start Date End Date Taking? Authorizing Provider  acetaminophen  (TYLENOL) 500 MG tablet Take 1,000 mg by mouth 2 (two) times daily as needed for moderate pain.   Yes Historical Provider, MD  oxyCODONE (OXY IR/ROXICODONE) 5 MG immediate release tablet Take 1 tablet (5 mg total) by mouth every 4 (four) hours as needed (pain scale 4-7). 05/29/15  Yes Jacklyn ShellFrances Cresenzo-Dishmon, CNM  naproxen (NAPROSYN) 500 MG tablet Take 1 tablet (500 mg total) by mouth 2 (two) times daily. Patient not taking: Reported on 11/10/2015 11/02/15   Janne NapoleonHope M Neese, NP    Family History Family History  Problem Relation Age of Onset  . Asthma Mother   . Diabetes Maternal Aunt   . Diabetes Maternal Uncle   . Hypertension Maternal Grandmother   . Diabetes Maternal Grandmother   . Asthma Maternal Grandmother     Social History Social History  Substance Use Topics  . Smoking status: Never Smoker  . Smokeless tobacco: Never Used  . Alcohol use No     Allergies   Shellfish allergy and Zoloft [sertraline hcl]   Review of Systems Review of Systems  10 systems reviewed and found to be negative, except as noted in the HPI.   Physical Exam Updated Vital Signs BP 116/93 (BP Location: Right Arm)   Pulse (!) 59   Temp 98.7 F (37.1 C) (Oral)   Resp 14  LMP 09/29/2015   SpO2 100%   Physical Exam  Constitutional: She is oriented to person, place, and time. She appears well-developed and well-nourished. No distress.  HENT:  Head: Normocephalic and atraumatic.  Mouth/Throat: Oropharynx is clear and moist.  Eyes: Conjunctivae and EOM are normal. Pupils are equal, round, and reactive to light.  Neck: Normal range of motion.  Cardiovascular: Normal rate, regular rhythm and intact distal pulses.   Pulmonary/Chest: Effort normal and breath sounds normal.  Abdominal: Soft. There is no tenderness.  Genitourinary:  Genitourinary Comments: Pelvic exam a chaperoned by technician: No rashes or lesions, patient has blood tinged vaginal discharge. No cervical motion or adnexal  tenderness.  Musculoskeletal: Normal range of motion.  Neurological: She is alert and oriented to person, place, and time.  Skin: She is not diaphoretic.  Psychiatric: She has a normal mood and affect.  Nursing note and vitals reviewed.    ED Treatments / Results  Labs (all labs ordered are listed, but only abnormal results are displayed) Labs Reviewed  WET PREP, GENITAL  WET PREP, GENITAL  RPR  HIV ANTIBODY (ROUTINE TESTING)  I-STAT BETA HCG BLOOD, ED ([facility], [facility], [facility] ONLY)  I-STAT CHEM 8, ED  GC/CHLAMYDIA PROBE AMP ([place]) NOT AT [facility]    EKG  EKG Interpretation None       Radiology No results found.  Procedures Procedures (including critical care time)  Medications Ordered in ED Medications  fluconazole (DIFLUCAN) tablet 200 mg (not administered)     Initial Impression / Assessment and Plan / ED Course  I have reviewed the triage vital signs and the nursing notes.  Pertinent labs & imaging results that were available during my care of the patient were reviewed by me and considered in my medical decision making (see chart for details).  Clinical Course    Vitals:   11/10/15 1044 11/10/15 1242 11/10/15 1503 11/10/15 1546  BP: 122/92 121/75 122/89 116/93  Pulse: 100 90 76 (!) 59  Resp: 18 18 16 14   Temp: 98.7 F (37.1 C)     TempSrc: Oral     SpO2: 100% 98% 100% 100%    Medications  fluconazole (DIFLUCAN) tablet 200 mg (not administered)    Erica Harrell is 20 y.o. female presenting with Passage of tissue and clots and irregular menstrual periods. Abdominal exam is benign. Urine pregnancy resulting from several hours ago is negative. Pelvic exam with blood-tinged discharge. No cervical or adnexal tenderness. Wet prep manually resulted as he states, no clue cells trigger sperm, there are many white blood cells. I don't think this is a cervicitis. GC chlamydia and other STD tests are pending. Patient is given referral to women's, has encouraged her to  consider Implanon as her Depo shot is inconsistently administered due to noncompliance.  Evaluation does not show pathology that would require ongoing emergent intervention or inpatient treatment. Pt is hemodynamically stable and mentating appropriately. Discussed findings and plan with patient/guardian, who agrees with care plan. All questions answered. Return precautions discussed and outpatient follow up given.      Final Clinical Impressions(s) / ED Diagnoses   Final diagnoses:  Menstrual periods irregular  Vaginal yeast infection    New Prescriptions New Prescriptions   No medications on file     Wynetta Emery, PA-C 11/10/15 1615    Cathren Laine, MD 11/10/15 1620

## 2015-11-10 NOTE — ED Triage Notes (Signed)
Pt reports that she has had vaginal bleeding with clotting tonight. States that she has not really had a period, just been spotting on and off since her period on Aug 8th. C/o pain on the left lower abdomen.

## 2015-11-10 NOTE — ED Notes (Signed)
Pt oob to br with steady gait 

## 2015-11-10 NOTE — ED Notes (Signed)
Pt called for in waiting area for vital signs. No answer no response 

## 2015-11-10 NOTE — ED Notes (Signed)
Baby nin bed with patient

## 2015-11-10 NOTE — ED Notes (Signed)
Pt did not answer x 3 

## 2015-11-10 NOTE — Discharge Instructions (Signed)
Do not hesitate to return to the emergency room for any new, worsening or concerning symptoms. ° °Please obtain primary care using resource guide below. Let them know that you were seen in the emergency room and that they will need to obtain records for further outpatient management. ° ° °

## 2015-11-11 LAB — GC/CHLAMYDIA PROBE AMP (~~LOC~~) NOT AT ARMC
CHLAMYDIA, DNA PROBE: NEGATIVE
NEISSERIA GONORRHEA: NEGATIVE

## 2015-11-11 LAB — HIV ANTIBODY (ROUTINE TESTING W REFLEX): HIV Screen 4th Generation wRfx: NONREACTIVE

## 2015-11-11 LAB — RPR: RPR: NONREACTIVE

## 2015-11-30 ENCOUNTER — Emergency Department (HOSPITAL_COMMUNITY)
Admission: EM | Admit: 2015-11-30 | Discharge: 2015-11-30 | Disposition: A | Discharge status: Home / Self Care | Payer: Medicaid Other | Attending: Emergency Medicine | Admitting: Emergency Medicine

## 2015-11-30 ENCOUNTER — Encounter (HOSPITAL_COMMUNITY): Payer: Self-pay

## 2015-11-30 DIAGNOSIS — M2550 Pain in unspecified joint: Secondary | ICD-10-CM | POA: Diagnosis not present

## 2015-11-30 DIAGNOSIS — G5603 Carpal tunnel syndrome, bilateral upper limbs: Secondary | ICD-10-CM

## 2015-11-30 DIAGNOSIS — N939 Abnormal uterine and vaginal bleeding, unspecified: Secondary | ICD-10-CM | POA: Insufficient documentation

## 2015-11-30 DIAGNOSIS — L739 Follicular disorder, unspecified: Secondary | ICD-10-CM | POA: Diagnosis not present

## 2015-11-30 DIAGNOSIS — J45909 Unspecified asthma, uncomplicated: Secondary | ICD-10-CM | POA: Insufficient documentation

## 2015-11-30 DIAGNOSIS — R0789 Other chest pain: Secondary | ICD-10-CM | POA: Insufficient documentation

## 2015-11-30 DIAGNOSIS — R519 Headache, unspecified: Secondary | ICD-10-CM

## 2015-11-30 DIAGNOSIS — E876 Hypokalemia: Secondary | ICD-10-CM | POA: Diagnosis not present

## 2015-11-30 DIAGNOSIS — B379 Candidiasis, unspecified: Secondary | ICD-10-CM | POA: Diagnosis not present

## 2015-11-30 DIAGNOSIS — Z79899 Other long term (current) drug therapy: Secondary | ICD-10-CM | POA: Insufficient documentation

## 2015-11-30 DIAGNOSIS — R51 Headache: Secondary | ICD-10-CM | POA: Diagnosis not present

## 2015-11-30 DIAGNOSIS — R079 Chest pain, unspecified: Secondary | ICD-10-CM

## 2015-11-30 LAB — CBC WITH DIFFERENTIAL/PLATELET
Basophils Absolute: 0 10*3/uL (ref 0.0–0.1)
Basophils Relative: 0 %
EOS ABS: 0.4 10*3/uL (ref 0.0–0.7)
Eosinophils Relative: 6 %
HEMATOCRIT: 41.9 % (ref 36.0–46.0)
HEMOGLOBIN: 13.9 g/dL (ref 12.0–15.0)
LYMPHS ABS: 2.4 10*3/uL (ref 0.7–4.0)
Lymphocytes Relative: 35 %
MCH: 29.2 pg (ref 26.0–34.0)
MCHC: 33.2 g/dL (ref 30.0–36.0)
MCV: 88 fL (ref 78.0–100.0)
MONOS PCT: 9 %
Monocytes Absolute: 0.6 10*3/uL (ref 0.1–1.0)
NEUTROS PCT: 50 %
Neutro Abs: 3.4 10*3/uL (ref 1.7–7.7)
Platelets: 285 10*3/uL (ref 150–400)
RBC: 4.76 MIL/uL (ref 3.87–5.11)
RDW: 12.5 % (ref 11.5–15.5)
WBC: 6.8 10*3/uL (ref 4.0–10.5)

## 2015-11-30 LAB — COMPREHENSIVE METABOLIC PANEL
ALK PHOS: 106 U/L (ref 38–126)
ALT: 14 U/L (ref 14–54)
ANION GAP: 7 (ref 5–15)
AST: 22 U/L (ref 15–41)
Albumin: 3.7 g/dL (ref 3.5–5.0)
BILIRUBIN TOTAL: 0.5 mg/dL (ref 0.3–1.2)
BUN: 8 mg/dL (ref 6–20)
CALCIUM: 9.4 mg/dL (ref 8.9–10.3)
CO2: 25 mmol/L (ref 22–32)
Chloride: 107 mmol/L (ref 101–111)
Creatinine, Ser: 0.69 mg/dL (ref 0.44–1.00)
Glucose, Bld: 110 mg/dL — ABNORMAL HIGH (ref 65–99)
Potassium: 3.1 mmol/L — ABNORMAL LOW (ref 3.5–5.1)
Sodium: 139 mmol/L (ref 135–145)
TOTAL PROTEIN: 7.3 g/dL (ref 6.5–8.1)

## 2015-11-30 MED ORDER — POTASSIUM CHLORIDE ER 10 MEQ PO TBCR: 20.0000 meq | EXTENDED_RELEASE_TABLET | Freq: Two times a day (BID) | ORAL | 0 refills | Status: DC

## 2015-11-30 MED ORDER — POTASSIUM CHLORIDE ER 10 MEQ PO TBCR: 10.0000 meq | EXTENDED_RELEASE_TABLET | Freq: Two times a day (BID) | ORAL | 0 refills | Status: DC

## 2015-11-30 MED ORDER — FLUCONAZOLE 100 MG PO TABS
200.0000 mg | ORAL_TABLET | Freq: Once | ORAL | Status: AC
  Administered 2015-11-30: 200 mg via ORAL
  Filled 2015-11-30: qty 2

## 2015-11-30 MED ORDER — POTASSIUM CHLORIDE CRYS ER 20 MEQ PO TBCR
20.0000 meq | EXTENDED_RELEASE_TABLET | Freq: Once | ORAL | Status: AC
  Administered 2015-11-30: 20 meq via ORAL
  Filled 2015-11-30: qty 1

## 2015-11-30 NOTE — ED Provider Notes (Signed)
[facility]PT Provider Note   CSN: [id number] Arrival date & time: 11/30/15  1201     History   Chief Complaint Chief Complaint  Patient presents with  . Rash  . Joint Pain    HPI Erica Harrell is a 20 y.o. female.  HPI   1 month of joint pain, all joints, feet swelling, dull aching pain in feet, other joints not painful 2-3 weeks of rash, headaches, chest pain No fevers, no dyspnea, no recent illness or abdominal pain Abnormal periods, still bleeding/passing clots since last menses which is abnormal Was tested weeks ago for STDs but had yeast infection and was treated but feels like symptoms are the same  Joint pain--1 month, worse when first waking in the morning, and at night. However improves with movement during the day. Describes a tingling or burning pain, which migrates to the bilateral lower extremities and bilateral upper extremities over the last month. It comes and goes. Reports that tingling in her hands in particular is worse in the morning. Reports to her thumbs and first 3 fingers as areas of most intense symptoms.  Headache reports diffuse, reports they happen daily, usually during the day. Denies any nightly headaches, denies waking up in the middle of night or in the morning with headaches, denies nausea and vomiting. Denies any numbness, weakness, difficulty talking, difficulty walking. No trauma. No sudden onset.   Denies fevers, neck stiffness.  Chest pain has been daily also for the last month. Not exertional. Comes and goes spontaneously with rest. Not pleuritic or positional. Describes as a pressure that comes and goes daily. No significant shortness of breath. No orthopnea, no paroxysmal nocturnal dyspnea.  Past Medical History:  Diagnosis Date  . Asthma   . Bacterial vaginosis   . Constipation   . Depression   . Headache(784.0)   . Pelvic inflammatory disease   . Seasonal allergies     Patient Active Problem List   Diagnosis Date  Noted  . Post term pregnancy, 41 weeks 05/26/2015    Past Surgical History:  Procedure Laterality Date  . CESAREAN SECTION    . CESAREAN SECTION N/A 05/27/2015   Procedure: CESAREAN SECTION;  Surgeon: Adam PhenixJames G Arnold, MD;  Location: [facility];  Service: Obstetrics;  Laterality: N/A;  . VAGINAL DELIVERY      OB History    Gravida Para Term Preterm AB Living   3 2 2   1 2    SAB TAB Ectopic Multiple Live Births   1     0 2      Obstetric Comments   Elective C/S       Home Medications    Prior to Admission medications   Medication Sig Start Date End Date Taking? Authorizing Provider  albuterol (VENTOLIN HFA) 108 (90 Base) MCG/ACT inhaler Inhale 1-2 puffs into the lungs every 6 (six) hours as needed for wheezing or shortness of breath.   Yes Historical Provider, MD  naproxen (NAPROSYN) 500 MG tablet Take 1 tablet (500 mg total) by mouth 2 (two) times daily. Patient not taking: Reported on 11/30/2015 11/02/15   Janne NapoleonHope M Neese, NP  oxyCODONE (OXY IR/ROXICODONE) 5 MG immediate release tablet Take 1 tablet (5 mg total) by mouth every 4 (four) hours as needed (pain scale 4-7). Patient not taking: Reported on 11/30/2015 05/29/15   Jacklyn ShellFrances Cresenzo-Dishmon, CNM  potassium chloride (K-DUR) 10 MEQ tablet Take 1 tablet (10 mEq total) by mouth 2 (two) times daily. 11/30/15 12/02/15  Alvira MondayErin Hosteen Kienast, MD  Family History Family History  Problem Relation Age of Onset  . Asthma Mother   . Diabetes Maternal Aunt   . Diabetes Maternal Uncle   . Hypertension Maternal Grandmother   . Diabetes Maternal Grandmother   . Asthma Maternal Grandmother     Social History Social History  Substance Use Topics  . Smoking status: Never Smoker  . Smokeless tobacco: Never Used  . Alcohol use No     Allergies   Shellfish allergy and Zoloft [sertraline hcl]   Review of Systems Review of Systems  Cardiovascular: Positive for chest pain.  Genitourinary: Positive for menstrual problem and vaginal bleeding.    Musculoskeletal: Positive for arthralgias.  Skin: Positive for rash.  Neurological: Positive for headaches.     Physical Exam Updated Vital Signs BP 125/85   Pulse 89   Temp 97.8 F (36.6 C) (Oral)   Resp 19   Ht 5\' 2"  (1.575 m)   Wt 158 lb 1 oz (71.7 kg)   SpO2 100%   BMI 28.91 kg/m   Physical Exam  Constitutional: She is oriented to person, place, and time. She appears well-developed and well-nourished. No distress.  HENT:  Head: Normocephalic and atraumatic.  Eyes: Conjunctivae and EOM are normal.  Neck: Normal range of motion.  Cardiovascular: Normal rate, regular rhythm, normal heart sounds and intact distal pulses.  Exam reveals no gallop and no friction rub.   No murmur heard. Pulmonary/Chest: Effort normal and breath sounds normal. No respiratory distress. She has no wheezes. She has no rales.  Abdominal: Soft. She exhibits no distension. There is no tenderness. There is no guarding.  Genitourinary:  Genitourinary Comments: Exterior exam, no sign of skin infection, thick white discharge  Musculoskeletal: She exhibits no edema or tenderness.       Right hip: She exhibits normal strength.       Left hip: She exhibits normal strength.       Right knee: She exhibits normal range of motion and no swelling.       Left knee: She exhibits normal range of motion and no swelling.       Right ankle: She exhibits normal range of motion, no swelling, no ecchymosis, no deformity, no laceration and normal pulse.       Left ankle: She exhibits normal range of motion, no swelling, no ecchymosis, no deformity, no laceration and normal pulse.  Neurological: She is alert and oriented to person, place, and time. She has normal strength. She displays no tremor. No cranial nerve deficit or sensory deficit. Coordination normal. GCS eye subscore is 4. GCS verbal subscore is 5. GCS motor subscore is 6.  Skin: Skin is warm and dry. Capillary refill takes less than 2 seconds. No rash noted. She  is not diaphoretic. No erythema.  Pinpoint cluster of scabs to chin, ulcer to left ear  Nursing note and vitals reviewed.    ED Treatments / Results  Labs (all labs ordered are listed, but only abnormal results are displayed) Labs Reviewed  COMPREHENSIVE METABOLIC PANEL - Abnormal; Notable for the following:       Result Value   Potassium 3.1 (*)    Glucose, Bld 110 (*)    All other components within normal limits  CBC WITH DIFFERENTIAL/PLATELET    EKG  EKG Interpretation  Date/Time:  Monday November 30 2015 17:00:23 EDT Ventricular Rate:  61 PR Interval:    QRS Duration: 100 QT Interval:  409 QTC Calculation: 412 R Axis:   73  Text Interpretation:  Sinus rhythm No significant change since last tracing Confirmed by [facility] MD, Sheena Donegan ([id number]) on 11/30/2015 6:10:46 PM       Radiology No results found.  Procedures Procedures (including critical care time)  Medications Ordered in ED Medications  potassium chloride SA (K-DUR,KLOR-CON) CR tablet 20 mEq (20 mEq Oral Given 11/30/15 2006)  fluconazole (DIFLUCAN) tablet 200 mg (200 mg Oral Given 11/30/15 2006)     Initial Impression / Assessment and Plan / ED Course  I have reviewed the triage vital signs and the nursing notes.  Pertinent labs & imaging results that were available during my care of the patient were reviewed by me and considered in my medical decision making (see chart for details).  Clinical Course   21 year old female with a history of asthma, depression, pelvic inflammatory disease who presents with multiple concerns today. Mom and patient report concerns regarding her symptoms, as they report family members had been diagnosed in later stages of cancer, as well as had passed away from congestive heart failure. Family is concerned that she may have disorder such as lupus.  Discussed that in the emergency department, we will evaluate each of her symptoms, and look for emergent disorders. Regarding patient's  rash, does not have classic appearance of lupus. She has an area that appears to be healing folliculitis on her chin, as well as an ulceration over her left ear. There is no sign of surrounding erythema, no fluctuance, have low suspicion for abscess or cellulitis. The lesions do not appear herpetic. 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 Recommend local wound care, including bacitracin, monitoring for infection.  Regarding her headaches, no sudden onset to suggest subarachnoid hemorrhage, no fevers to suggest meningitis, no red flags of nighttime early morning headaches. Her neurologic exam is normal. Recommend PCP follow-up if she has continued headaches for further evaluation and imaging. Discussed reasons to return.  Regarding patient's polyarthralgias. She has no signs of septic arthritis, there is no asymmetric swelling of her extremities to suggest DVT. There is no significant edema to suggest congestive heart failure. She has no orthopnea, no paroxysmal nocturnal dyspnea, no crackles on exam, have low suspicion for acute congestive heart failure. Discussed possibility of obtaining an x-ray performed for evaluation with patient, however discussed clinically I have a low suspicion for edema, pneumonia or collapsed lung, and patient agrees with forgoing imaging at this time.  Regarding her chest pain, EKG shows no acute ST changes or signs of pericarditis, and given it has been ongoing over the last month, have low suspicion for ACS. She is low risk for anginal symptoms.   Patient does describe some tingling pain in her bilateral hands, which is worse upon waking, and is distributed in the median nerve distribution. Feel symptoms may be consistent with carpal tunnel. Place patient in bilateral wrist splints, I'm recommending continued to be followed  Discussed lab results with patient in detail, including normal renal function, normal hemoglobin, normal white blood cell count, normal platelets. Her  potassium is 3.1, and this may contribute to some of her body aches. She is given potassium in the emergency department and given a prescription for a few days of potassium.  In addition, patient reports she was treated for a [place] infection weeks ago, however the symptoms are not resolved. We'll give her 1 dose of oral fluconazole. We'll perform full pelvic exam per patient's wishes and recent evaluation, however do agree that vaginal discharge present on exam is more most consistent with  yeast.  Discussed results and thought process with patient and her mother in detail. Recommend when she is able to follow up with a primary care physician, recommend she does so, for full evaluation. Her symptoms of polyarthralgias worse in the morning, and improving throughout the day, could be consistent with an inflammatory arthritis.  Final Clinical Impressions(s) / ED Diagnoses   Final diagnoses:  Folliculitis  Bilateral carpal tunnel syndrome, suspected  Hypokalemia  Chest pain, unspecified type  Vaginal bleeding  Yeast infection  Polyarthralgia  Nonintractable headache, unspecified chronicity pattern, unspecified headache type    New Prescriptions Discharge Medication List as of 11/30/2015  8:06 PM    START taking these medications   Details  potassium chloride (K-DUR) 10 MEQ tablet Take 1 tablet (10 mEq total) by mouth 2 (two) times daily., Starting Mon 11/30/2015, Until Wed 12/02/2015, Print         Alvira Monday, MD 12/01/15 [phone]

## 2015-11-30 NOTE — ED Triage Notes (Signed)
Patient complains of generalized joing pain, rash to face, ears, groin since may, request to be evaluated for lupus. No distress, NAD

## 2015-11-30 NOTE — ED Notes (Signed)
MD at bedside. 

## 2015-11-30 NOTE — ED Notes (Signed)
Pt departed in NAD.  

## 2015-12-02 ENCOUNTER — Ambulatory Visit: Payer: Self-pay | Admitting: Obstetrics and Gynecology

## 2015-12-14 ENCOUNTER — Other Ambulatory Visit (HOSPITAL_COMMUNITY): Payer: Self-pay | Admitting: Advanced Practice Midwife

## 2016-03-27 ENCOUNTER — Emergency Department (HOSPITAL_COMMUNITY)
Admission: EM | Admit: 2016-03-27 | Discharge: 2016-03-27 | Disposition: A | Discharge status: Home / Self Care | Payer: Medicaid Other | Attending: Emergency Medicine | Admitting: Emergency Medicine

## 2016-03-27 ENCOUNTER — Encounter (HOSPITAL_COMMUNITY): Payer: Self-pay | Admitting: *Deleted

## 2016-03-27 DIAGNOSIS — R197 Diarrhea, unspecified: Secondary | ICD-10-CM | POA: Diagnosis present

## 2016-03-27 DIAGNOSIS — J45909 Unspecified asthma, uncomplicated: Secondary | ICD-10-CM | POA: Insufficient documentation

## 2016-03-27 DIAGNOSIS — R112 Nausea with vomiting, unspecified: Secondary | ICD-10-CM | POA: Insufficient documentation

## 2016-03-27 DIAGNOSIS — Z79899 Other long term (current) drug therapy: Secondary | ICD-10-CM | POA: Diagnosis not present

## 2016-03-27 DIAGNOSIS — F1290 Cannabis use, unspecified, uncomplicated: Secondary | ICD-10-CM | POA: Insufficient documentation

## 2016-03-27 DIAGNOSIS — R1115 Cyclical vomiting syndrome unrelated to migraine: Secondary | ICD-10-CM

## 2016-03-27 DIAGNOSIS — E860 Dehydration: Secondary | ICD-10-CM

## 2016-03-27 LAB — COMPREHENSIVE METABOLIC PANEL
ALT: 15 U/L (ref 14–54)
AST: 28 U/L (ref 15–41)
Albumin: 3.6 g/dL (ref 3.5–5.0)
Alkaline Phosphatase: 80 U/L (ref 38–126)
Anion gap: 10 (ref 5–15)
BUN: 10 mg/dL (ref 6–20)
CO2: 18 mmol/L — ABNORMAL LOW (ref 22–32)
CREATININE: 0.61 mg/dL (ref 0.44–1.00)
Calcium: 8.9 mg/dL (ref 8.9–10.3)
Chloride: 108 mmol/L (ref 101–111)
Glucose, Bld: 81 mg/dL (ref 65–99)
POTASSIUM: 3.9 mmol/L (ref 3.5–5.1)
Sodium: 136 mmol/L (ref 135–145)
Total Bilirubin: 0.8 mg/dL (ref 0.3–1.2)
Total Protein: 6.8 g/dL (ref 6.5–8.1)

## 2016-03-27 LAB — CBC
HEMATOCRIT: 36.3 % (ref 36.0–46.0)
Hemoglobin: 13.1 g/dL (ref 12.0–15.0)
MCH: 32.1 pg (ref 26.0–34.0)
MCHC: 36.1 g/dL — ABNORMAL HIGH (ref 30.0–36.0)
MCV: 89 fL (ref 78.0–100.0)
Platelets: 186 10*3/uL (ref 150–400)
RBC: 4.08 MIL/uL (ref 3.87–5.11)
RDW: 12.8 % (ref 11.5–15.5)
WBC: 3.3 10*3/uL — AB (ref 4.0–10.5)

## 2016-03-27 LAB — URINALYSIS, ROUTINE W REFLEX MICROSCOPIC
Bilirubin Urine: NEGATIVE
Glucose, UA: NEGATIVE mg/dL
Hgb urine dipstick: NEGATIVE
KETONES UR: 20 mg/dL — AB
LEUKOCYTES UA: NEGATIVE
NITRITE: NEGATIVE
PROTEIN: NEGATIVE mg/dL
Specific Gravity, Urine: 1.031 — ABNORMAL HIGH (ref 1.005–1.030)
pH: 5 (ref 5.0–8.0)

## 2016-03-27 LAB — LIPASE, BLOOD: LIPASE: 29 U/L (ref 11–51)

## 2016-03-27 LAB — POC URINE PREG, ED: PREG TEST UR: NEGATIVE

## 2016-03-27 LAB — I-STAT CG4 LACTIC ACID, ED: Lactic Acid, Venous: 0.79 mmol/L (ref 0.5–1.9)

## 2016-03-27 MED ORDER — RANITIDINE HCL 150 MG PO TABS: 150.0000 mg | ORAL_TABLET | Freq: Two times a day (BID) | ORAL | 0 refills | Status: DC

## 2016-03-27 MED ORDER — ONDANSETRON 4 MG PO TBDP: 4.0000 mg | ORAL_TABLET | Freq: Three times a day (TID) | ORAL | 0 refills | Status: DC | PRN

## 2016-03-27 MED ORDER — ONDANSETRON 4 MG PO TBDP
8.0000 mg | ORAL_TABLET | Freq: Once | ORAL | Status: AC
  Administered 2016-03-27: 8 mg via ORAL
  Filled 2016-03-27: qty 2

## 2016-03-27 MED ORDER — SODIUM CHLORIDE 0.9 % IV BOLUS (SEPSIS)
1000.0000 mL | Freq: Once | INTRAVENOUS | Status: AC
  Administered 2016-03-27: 1000 mL via INTRAVENOUS

## 2016-03-27 NOTE — ED Notes (Signed)
Gave pt sandwich and Ginger Ale, per Arlys JohnBrian - RN.

## 2016-03-27 NOTE — ED Provider Notes (Signed)
[facility] Harrell, VF CorporationPA-C.  Please see her full history of present illness and physical for further information.  Erica Harrell is a 21 y.o. female resents with complaints of recurrent nausea and vomiting for the last 2 weeks. She reports intermittent episodes since September 2017 but has not been evaluated for this. She reports 10 episodes of nonbloody nonbilious emesis today. Symptoms are worse with eating. Nothing makes them better.  Physical Exam  BP 111/71 (BP Location: Left Arm)   Pulse 87   Temp 98.6 F (37 C) (Oral)   Resp 16   Ht 5\' 3"  (1.6 m)   Wt 73.9 kg   LMP 03/07/2016   SpO2 97%   BMI 28.87 kg/m   Physical Exam  Constitutional: She appears well-developed and well-nourished. No distress.  HENT:  Head: Normocephalic.  Eyes: Conjunctivae are normal. No scleral icterus.  Neck: Normal range of motion.  Cardiovascular: Normal rate and intact distal pulses.   Pulmonary/Chest: Effort normal.  Abdominal: Soft. Bowel sounds are normal. She exhibits no mass. There is no tenderness. There is no rebound and no guarding.  Musculoskeletal: Normal range of motion.  Neurological: She is alert.  Skin: Skin is warm and dry.    ED Course  Procedures  Clinical Course as of Mar 28 108  Erica LinkSun Mar 27, 2016  2031 Leukopenia WBC: (!) 3.3 [HM]  2031 Decreased CO2: (!) 18 [HM]  2031 Within normal limits Lipase: 29 [HM]  2031 Normal AST/ALT AST: 28 [HM]  2032 At shift change, pending UA and pregnancy test. Patient given Zofran ODT. We'll give fluids and by mouth trial. Bicarbonate is low, will check lactic acid.  [HM]    Clinical Course User Index [HM] Erica ForthHannah Tucker Minter, PA-C     MDM Labs reassuring. No evidence of infection. Fluid bolus given due to signs of mild dehydration. Patient is tolerating by mouth fluids and food at this time without emesis. Discussed importance of marijuana cessation. Will give treatment for GERD and acute nausea treatment with  Zofran. Patient states understanding and is in agreement with the plan for discharge home. Primary care and GI referral given.   Non-intractable cyclical vomiting with nausea  Marijuana use  Dehydration         Erica ForthHannah Chimamanda Siegfried, PA-C 03/28/16 0110    Erica Pollackameron Isaacs, MD 03/28/16 [phone]

## 2016-03-27 NOTE — ED Triage Notes (Signed)
Pt arrived by [facility] for n/v x 2 weeks, having diarrhea today. Denies abd pain. No acute distress is noted at triage.

## 2016-03-27 NOTE — ED Provider Notes (Signed)
[facility] Provider Note   CSN: [id number] Arrival date & time: 03/27/16  1648     History   Chief Complaint Chief Complaint  Patient presents with  . Emesis  . Diarrhea    HPI Erica Harrell is a 21 y.o. female with a PMHx of asthma, constipation, and headaches, with a PSHx of C/S x2, who presents to the ED with complaints of recurrent nausea and vomiting that has been intermittent over the last 2 weeks. She states that she's had issues with intermittent n/v since Sept 2017 but hasn't followed up with anyone because she doesn't have a PCP. She reports that she's had 10 episodes of nonbloody nonbilious emesis today, has not tried anything for her symptoms, and her symptoms seem to worsen with eating. Triage staff reported that she complained of diarrhea, but pt denies that she has had this symptom. She admits to marijuana use, last use was today. LMP 03/12/16. She denies recent travel, sick contacts, suspicious food intake, EtOH use, NSAID use, or other abd surgeries aside from her C/S. She also denies fevers, chills, CP, SOB, abd pain, diarrhea, constipation, obstipation, melena, hematochezia, hematemesis, hematuria, dysuria, vaginal bleeding/discharge, myalgias, arthralgias, numbness, tingling, focal weakness, or any other complaints at this time.    The history is provided by the patient and medical records. No language interpreter was used.  Emesis   This is a recurrent problem. The current episode started more than 1 week ago. The problem occurs more than 10 times per day. The problem has not changed since onset.The emesis has an appearance of stomach contents. There has been no fever. Pertinent negatives include no abdominal pain, no arthralgias, no chills, no diarrhea, no fever and no myalgias.    Past Medical History:  Diagnosis Date  . Asthma   . Bacterial vaginosis   . Constipation   . Depression   . Headache(784.0)   . Pelvic inflammatory disease   . Seasonal  allergies     Patient Active Problem List   Diagnosis Date Noted  . Post term pregnancy, 41 weeks 05/26/2015    Past Surgical History:  Procedure Laterality Date  . CESAREAN SECTION    . CESAREAN SECTION N/A 05/27/2015   Procedure: CESAREAN SECTION;  Surgeon: Adam Phenix, MD;  Location: [facility];  Service: Obstetrics;  Laterality: N/A;  . VAGINAL DELIVERY      OB History    Gravida Para Term Preterm AB Living   3 2 2   1 2    SAB TAB Ectopic Multiple Live Births   1     0 2      Obstetric Comments   Elective C/S       Home Medications    Prior to Admission medications   Medication Sig Start Date End Date Taking? Authorizing Provider  albuterol (VENTOLIN HFA) 108 (90 Base) MCG/ACT inhaler Inhale 1-2 puffs into the lungs every 6 (six) hours as needed for wheezing or shortness of breath.    Historical Provider, MD  naproxen (NAPROSYN) 500 MG tablet Take 1 tablet (500 mg total) by mouth 2 (two) times daily. Patient not taking: Reported on 11/30/2015 11/02/15   Janne Napoleon, NP  oxyCODONE (OXY IR/ROXICODONE) 5 MG immediate release tablet Take 1 tablet (5 mg total) by mouth every 4 (four) hours as needed (pain scale 4-7). Patient not taking: Reported on 11/30/2015 05/29/15   Jacklyn Shell, CNM  potassium chloride (K-DUR) 10 MEQ tablet Take 1 tablet (10 mEq total) by mouth  2 (two) times daily. 11/30/15 12/02/15  Alvira Monday, MD    Family History Family History  Problem Relation Age of Onset  . Asthma Mother   . Diabetes Maternal Aunt   . Diabetes Maternal Uncle   . Hypertension Maternal Grandmother   . Diabetes Maternal Grandmother   . Asthma Maternal Grandmother     Social History Social History  Substance Use Topics  . Smoking status: Never Smoker  . Smokeless tobacco: Never Used  . Alcohol use No     Allergies   Shellfish allergy and Zoloft [sertraline hcl]   Review of Systems Review of Systems  Constitutional: Negative for chills and fever.    Respiratory: Negative for shortness of breath.   Cardiovascular: Negative for chest pain.  Gastrointestinal: Positive for nausea and vomiting. Negative for abdominal pain, blood in stool, constipation and diarrhea.  Genitourinary: Negative for dysuria, hematuria, vaginal bleeding and vaginal discharge.  Musculoskeletal: Negative for arthralgias and myalgias.  Skin: Negative for color change.  Allergic/Immunologic: Negative for immunocompromised state.  Neurological: Negative for weakness and numbness.  Psychiatric/Behavioral: Negative for confusion.   10 Systems reviewed and are negative for acute change except as noted in the HPI.   Physical Exam Updated Vital Signs BP 111/71 (BP Location: Left Arm)   Pulse 87   Temp 98.6 F (37 C) (Oral)   Resp 16   Ht 5\' 3"  (1.6 m)   Wt 73.9 kg   LMP 03/07/2016   SpO2 97%   BMI 28.87 kg/m   Physical Exam  Constitutional: She is oriented to person, place, and time. Vital signs are normal. She appears well-developed and well-nourished.  Non-toxic appearance. No distress.  Afebrile, nontoxic, NAD  HENT:  Head: Normocephalic and atraumatic.  Mouth/Throat: Oropharynx is clear and moist and mucous membranes are normal.  Eyes: Conjunctivae and EOM are normal. Right eye exhibits no discharge. Left eye exhibits no discharge.  Neck: Normal range of motion. Neck supple.  Cardiovascular: Normal rate, regular rhythm, normal heart sounds and intact distal pulses.  Exam reveals no gallop and no friction rub.   No murmur heard. Pulmonary/Chest: Effort normal and breath sounds normal. No respiratory distress. She has no decreased breath sounds. She has no wheezes. She has no rhonchi. She has no rales.  Abdominal: Soft. Normal appearance and bowel sounds are normal. She exhibits no distension. There is no tenderness. There is no rigidity, no rebound, no guarding, no CVA tenderness, no tenderness at McBurney's point and negative Murphy's sign.  Soft, NTND,  +BS throughout, no r/g/r, neg murphy's, neg mcburney's, no CVA TTP   Musculoskeletal: Normal range of motion.  Neurological: She is alert and oriented to person, place, and time. She has normal strength. No sensory deficit.  Skin: Skin is warm, dry and intact. No rash noted.  Psychiatric: She has a normal mood and affect.  Nursing note and vitals reviewed.    ED Treatments / Results  Labs (all labs ordered are listed, but only abnormal results are displayed) Labs Reviewed  COMPREHENSIVE METABOLIC PANEL - Abnormal; Notable for the following:       Result Value   CO2 18 (*)    All other components within normal limits  CBC - Abnormal; Notable for the following:    WBC 3.3 (*)    MCHC 36.1 (*)    All other components within normal limits  LIPASE, BLOOD  URINALYSIS, ROUTINE W REFLEX MICROSCOPIC  POC URINE PREG, ED    EKG  EKG Interpretation  None       Radiology No results found.  Procedures Procedures (including critical care time)  Medications Ordered in ED Medications  ondansetron (ZOFRAN-ODT) disintegrating tablet 8 mg (not administered)     Initial Impression / Assessment and Plan / ED Course  I have reviewed the triage vital signs and the nursing notes.  Pertinent labs & imaging results that were available during my care of the patient were reviewed by me and considered in my medical decision making (see chart for details).     21 y.o. female here with intermittent n/v x2 wks, somewhat recurrent issue ongoing since sept 2017; no PCP care so hasn't followed up. [facility], which could be contributing. No pelvic pain or vaginal complaints, no abdominal tenderness on exam; nursing notes state she reported diarrhea but pt denies this to me. Overall well appearing. Lipase WNL, CMP unremarkable, CBC unremarkable. Awaiting U/A and Upreg. Will give zofran and PO challenge, if pt able to tolerate PO well we can likely d/c home with rx for zofran; doubt need for  imaging. Marijuana use cessation advised. Will reassess shortly  8:05 PM Meds not yet given, U/A and Upreg not yet done. Patient care to be resumed by [facility] Muthersbaugh, PA-C at shift change sign-out. Patient history has been discussed with midlevel resuming care. Pt has not yet received zofran, U/A and Upreg pending. Plan is to see if she has improvement of symptoms with zofran, if she can tolerate PO well after that then d/c home with zofran rx and have her f/up with [facility] in 1wk to establish medical care and recheck symptoms; if intractable symptoms, consider admission. Marijuana cessation advised. Please see their notes for further documentation of pending results and dispo/care. Pt stable at sign-out and updated on transfer of care.   Final Clinical Impressions(s) / ED Diagnoses   Final diagnoses:  Non-intractable cyclical vomiting with nausea  Marijuana use    New Prescriptions New Prescriptions   ONDANSETRON (ZOFRAN ODT) 4 MG DISINTEGRATING TABLET    Take 1 tablet (4 mg total) by mouth every 8 (eight) hours as needed for nausea or vomiting.     [address]Mercedes Giang Hemme, PA-C 03/27/16 2005    Shaune Pollackameron Isaacs, MD 03/28/16 [phone]

## 2016-03-27 NOTE — ED Notes (Signed)
Patient ambulated to the room with no assistance

## 2016-03-27 NOTE — Discharge Instructions (Addendum)
Stop marijuana usage as this can contribute to symptoms.  Use zofran as directed as needed for nausea. Take Zantac to help with reflux symptoms.  Stay well hydrated with small sips of fluids throughout the day. Use the resource guide to find a primary care physician in the next 1-2 weeks to establish medical care and recheck on symptoms. Return to the ER for emergent changes or worsening symptoms.

## 2016-03-27 NOTE — ED Notes (Signed)
Patient Alert and oriented X4. Stable and ambulatory. Patient verbalized understanding of the discharge instructions.  Patient belongings were taken by the patient.  

## 2016-03-27 NOTE — ED Notes (Signed)
Gave pt apple juice, per Arlys JohnBrian - RN.

## 2016-03-28 ENCOUNTER — Encounter: Payer: Self-pay | Admitting: Physician Assistant

## 2016-04-06 ENCOUNTER — Ambulatory Visit: Payer: Medicaid Other | Admitting: Physician Assistant

## 2017-01-25 ENCOUNTER — Emergency Department (HOSPITAL_COMMUNITY)
Admission: EM | Admit: 2017-01-25 | Discharge: 2017-01-25 | Disposition: A | Discharge status: Home / Self Care | Payer: Medicaid Other | Attending: Emergency Medicine | Admitting: Emergency Medicine

## 2017-01-25 ENCOUNTER — Encounter (HOSPITAL_COMMUNITY): Payer: Self-pay | Admitting: Emergency Medicine

## 2017-01-25 ENCOUNTER — Other Ambulatory Visit: Payer: Self-pay

## 2017-01-25 DIAGNOSIS — Z202 Contact with and (suspected) exposure to infections with a predominantly sexual mode of transmission: Secondary | ICD-10-CM | POA: Diagnosis present

## 2017-01-25 DIAGNOSIS — N760 Acute vaginitis: Secondary | ICD-10-CM | POA: Insufficient documentation

## 2017-01-25 DIAGNOSIS — Z79899 Other long term (current) drug therapy: Secondary | ICD-10-CM | POA: Insufficient documentation

## 2017-01-25 DIAGNOSIS — J45909 Unspecified asthma, uncomplicated: Secondary | ICD-10-CM | POA: Diagnosis not present

## 2017-01-25 DIAGNOSIS — A499 Bacterial infection, unspecified: Secondary | ICD-10-CM | POA: Diagnosis not present

## 2017-01-25 DIAGNOSIS — B9689 Other specified bacterial agents as the cause of diseases classified elsewhere: Secondary | ICD-10-CM

## 2017-01-25 LAB — URINALYSIS, ROUTINE W REFLEX MICROSCOPIC
Bilirubin Urine: NEGATIVE
Glucose, UA: NEGATIVE mg/dL
Hgb urine dipstick: NEGATIVE
Ketones, ur: 80 mg/dL — AB
Leukocytes, UA: NEGATIVE
Nitrite: NEGATIVE
Protein, ur: NEGATIVE mg/dL
Specific Gravity, Urine: 1.026 (ref 1.005–1.030)
pH: 5 (ref 5.0–8.0)

## 2017-01-25 LAB — WET PREP, GENITAL
Sperm: NONE SEEN
Trich, Wet Prep: NONE SEEN
Yeast Wet Prep HPF POC: NONE SEEN

## 2017-01-25 LAB — PREGNANCY, URINE: Preg Test, Ur: NEGATIVE

## 2017-01-25 MED ORDER — LIDOCAINE HCL (PF) 1 % IJ SOLN
2.0000 mL | Freq: Once | INTRAMUSCULAR | Status: DC
  Filled 2017-01-25: qty 2

## 2017-01-25 MED ORDER — METRONIDAZOLE 500 MG PO TABS: 500.0000 mg | ORAL_TABLET | Freq: Two times a day (BID) | ORAL | 0 refills | Status: DC

## 2017-01-25 MED ORDER — AZITHROMYCIN 250 MG PO TABS
1000.0000 mg | ORAL_TABLET | Freq: Once | ORAL | Status: AC
  Administered 2017-01-25: 1000 mg via ORAL
  Filled 2017-01-25: qty 4

## 2017-01-25 MED ORDER — CEFTRIAXONE SODIUM 250 MG IJ SOLR
250.0000 mg | Freq: Once | INTRAMUSCULAR | Status: AC
  Administered 2017-01-25: 250 mg via INTRAMUSCULAR
  Filled 2017-01-25: qty 250

## 2017-01-25 NOTE — ED Provider Notes (Signed)
[facility] Provider Note   CSN: [id number] Arrival date & time: 01/25/17  1153     History   Chief Complaint Chief Complaint  Patient presents with  . Exposure to STD    HPI Erica Harrell is a 21 y.o. female who presents to ED for evaluation of vaginal discharge for the past week associated with nausea.  She describes white/yellow vaginal discharge which she initially thought was normal for her.  She did have unprotected sexual intercourse yesterday.  She was told by [facility] that while she was positive for chlamydia.  However she wanted to get more testing done in further evaluation.  She reports history of similar symptoms in the past.  She denies any abdominal pain, fevers, diarrhea, constipation, dysuria, back pain.  HPI  Past Medical History:  Diagnosis Date  . Asthma   . Bacterial vaginosis   . Constipation   . Depression   . Headache(784.0)   . Pelvic inflammatory disease   . Seasonal allergies     Patient Active Problem List   Diagnosis Date Noted  . Post term pregnancy, 41 weeks 05/26/2015    Past Surgical History:  Procedure Laterality Date  . CESAREAN SECTION    . CESAREAN SECTION N/A 05/27/2015   Procedure: CESAREAN SECTION;  Surgeon: Adam PhenixJames G Arnold, MD;  Location: [facility];  Service: Obstetrics;  Laterality: N/A;  . VAGINAL DELIVERY      OB History    Gravida Para Term Preterm AB Living   3 2 2   1 2    SAB TAB Ectopic Multiple Live Births   1     0 2      Obstetric Comments   Elective C/S       Home Medications    Prior to Admission medications   Medication Sig Start Date End Date Taking? Authorizing Provider  albuterol (VENTOLIN HFA) 108 (90 Base) MCG/ACT inhaler Inhale 1-2 puffs into the lungs every 6 (six) hours as needed for wheezing or shortness of breath.    [provider]  metroNIDAZOLE (FLAGYL) 500 MG tablet Take 1 tablet (500 mg total) by mouth 2 (two) times daily. 01/25/17    Lizeth Bencosme, PA-C  ondansetron (ZOFRAN ODT) 4 MG disintegrating tablet Take 1 tablet (4 mg total) by mouth every 8 (eight) hours as needed for nausea or vomiting. 03/27/16   Street, Mercedes, PA-C  ranitidine (ZANTAC) 150 MG tablet Take 1 tablet (150 mg total) by mouth 2 (two) times daily. 03/27/16   Muthersbaugh, Dahlia ClientHannah, PA-C    Family History Family History  Problem Relation Age of Onset  . Asthma Mother   . Diabetes Maternal Aunt   . Diabetes Maternal Uncle   . Hypertension Maternal Grandmother   . Diabetes Maternal Grandmother   . Asthma Maternal Grandmother     Social History Social History   Tobacco Use  . Smoking status: Never Smoker  . Smokeless tobacco: Never Used  Substance Use Topics  . Alcohol use: No  . Drug use: No     Allergies   Shellfish allergy and Zoloft [sertraline hcl]   Review of Systems Review of Systems  Constitutional: Negative for appetite change, chills and fever.  HENT: Negative for ear pain, rhinorrhea, sneezing and sore throat.   Eyes: Negative for photophobia and visual disturbance.  Respiratory: Negative for cough, chest tightness, shortness of breath and wheezing.   Cardiovascular: Negative for chest pain and palpitations.  Gastrointestinal: Positive for nausea. Negative for  abdominal pain, blood in stool, constipation, diarrhea and vomiting.  Genitourinary: Positive for vaginal discharge. Negative for dysuria, hematuria and urgency.  Musculoskeletal: Negative for myalgias.  Skin: Negative for rash.  Neurological: Negative for dizziness, weakness and light-headedness.     Physical Exam Updated Vital Signs BP 119/78   Pulse 89   Temp 98.4 F (36.9 C) (Oral)   Resp 18   LMP 01/23/2017   SpO2 99%   Physical Exam  Constitutional: She appears well-developed and well-nourished. No distress.  HENT:  Head: Normocephalic and atraumatic.  Nose: Nose normal.  Eyes: Conjunctivae and EOM are normal. Left eye exhibits no discharge. No  scleral icterus.  Neck: Normal range of motion. Neck supple.  Cardiovascular: Normal rate, regular rhythm, normal heart sounds and intact distal pulses. Exam reveals no gallop and no friction rub.  No murmur heard. Pulmonary/Chest: Effort normal and breath sounds normal. No respiratory distress.  Abdominal: Soft. Bowel sounds are normal. She exhibits no distension. There is no tenderness. There is no guarding.  Genitourinary: There is no tenderness on the right labia. There is no tenderness on the left labia. Cervix exhibits no motion tenderness, no discharge and no friability. Right adnexum displays no tenderness. Left adnexum displays no tenderness. No vaginal discharge found.  Genitourinary Comments: Pelvic exam: normal external genitalia without evidence of trauma. VULVA: normal appearing vulva with no masses, tenderness or lesion. VAGINA: normal appearing vagina with normal color and discharge, no lesions. CERVIX: normal appearing cervix without lesions, cervical motion tenderness absent, cervical os closed with out purulent discharge; White vaginal discharge noted. Wet prep and DNA probe for chlamydia and GC obtained.   ADNEXA: normal adnexa in size, nontender and no masses UTERUS: uterus is normal size, shape, consistency and nontender.   RN served as Biomedical engineer during exam.  Musculoskeletal: Normal range of motion. She exhibits no edema.  Neurological: She is alert. She exhibits normal muscle tone. Coordination normal.  Skin: Skin is warm and dry. No rash noted.  Psychiatric: She has a normal mood and affect.  Nursing note and vitals reviewed.    ED Treatments / Results  Labs (all labs ordered are listed, but only abnormal results are displayed) Labs Reviewed  WET PREP, GENITAL - Abnormal; Notable for the following components:      Result Value   Clue Cells Wet Prep HPF POC PRESENT (*)    WBC, Wet Prep HPF POC FEW (*)    All other components within normal limits  URINALYSIS,  ROUTINE W REFLEX MICROSCOPIC - Abnormal; Notable for the following components:   Ketones, ur 80 (*)    All other components within normal limits  PREGNANCY, URINE  HIV ANTIBODY (ROUTINE TESTING)  RPR  GC/CHLAMYDIA PROBE AMP ([place]) NOT AT [facility]    EKG  EKG Interpretation None       Radiology No results found.  Procedures Procedures (including critical care time)  Medications Ordered in ED Medications  cefTRIAXone (ROCEPHIN) injection 250 mg (not administered)  azithromycin (ZITHROMAX) tablet 1,000 mg (not administered)     Initial Impression / Assessment and Plan / ED Course  I have reviewed the triage vital signs and the nursing notes.  Pertinent labs & imaging results that were available during my care of the patient were reviewed by me and considered in my medical decision making (see chart for details).     Patient presents to ED for evaluation of vaginal discharge and not she has a history of unprotected sexual intercourse recently.  She was told by [facility] that she was positive for chlamydia however she came here because she wanted to get further testing done.  There was white vaginal discharge noted on pelvic exam but no cervical motion tenderness or adnexal tenderness.  She is afebrile with no history of fever.  Wet prep positive for clue cells.  Will treat empirically for gonorrhea and chlamydia here and advised patient to follow-up with lab work when available.  Patient appears stable for discharge at this time.  Strict return precautions given.  Final Clinical Impressions(s) / ED Diagnoses   Final diagnoses:  Exposure to STD  BV (bacterial vaginosis)    ED Discharge Orders        Ordered    metroNIDAZOLE (FLAGYL) 500 MG tablet  2 times daily     01/25/17 1537       Dietrich PatesKhatri, Thu [place], PA-C 01/25/17 1542    Melene PlanFloyd, Dan, DO 01/26/17 1607

## 2017-01-25 NOTE — Discharge Instructions (Signed)
Please read the attached information regarding your condition. Take Flagyl twice daily for 1 week. We will contact you about results of your remaining lab work when available. Return to ED for worsening symptoms, abdominal pain, increased discharge, fevers.

## 2017-01-25 NOTE — ED Triage Notes (Signed)
Pt to ER [facility], states they "checked my urine and told me I had chlamydia." was told to come here for treatment. Pt denies all symptoms.

## 2017-01-26 LAB — GC/CHLAMYDIA PROBE AMP (~~LOC~~) NOT AT ARMC
Chlamydia: POSITIVE — AB
Neisseria Gonorrhea: NEGATIVE

## 2017-01-26 LAB — HIV ANTIBODY (ROUTINE TESTING W REFLEX): HIV SCREEN 4TH GENERATION: NONREACTIVE

## 2017-01-26 LAB — RPR: RPR Ser Ql: NONREACTIVE

## 2017-06-16 ENCOUNTER — Emergency Department (HOSPITAL_COMMUNITY)
Admission: EM | Admit: 2017-06-16 | Discharge: 2017-06-16 | Discharge status: Against Medical Advice | Payer: Medicaid Other

## 2017-06-16 ENCOUNTER — Other Ambulatory Visit: Payer: Self-pay

## 2017-06-16 NOTE — ED Triage Notes (Signed)
No answer in waiting  Registration  Saw the pt leave

## 2017-06-18 ENCOUNTER — Emergency Department (HOSPITAL_COMMUNITY)
Admission: EM | Admit: 2017-06-18 | Discharge: 2017-06-18 | Disposition: A | Discharge status: Home / Self Care | Payer: Medicaid Other | Attending: Emergency Medicine | Admitting: Emergency Medicine

## 2017-06-18 ENCOUNTER — Other Ambulatory Visit: Payer: Self-pay

## 2017-06-18 ENCOUNTER — Encounter (HOSPITAL_COMMUNITY): Payer: Self-pay | Admitting: Emergency Medicine

## 2017-06-18 ENCOUNTER — Emergency Department (HOSPITAL_COMMUNITY): Payer: Medicaid Other

## 2017-06-18 DIAGNOSIS — J4520 Mild intermittent asthma, uncomplicated: Secondary | ICD-10-CM | POA: Insufficient documentation

## 2017-06-18 LAB — I-STAT BETA HCG BLOOD, ED (MC, WL, AP ONLY): I-stat hCG, quantitative: <5 m[IU]/mL (ref <5)

## 2017-06-18 MED ORDER — PREDNISONE 50 MG PO TABS: ORAL_TABLET | ORAL | 0 refills | Status: DC

## 2017-06-18 MED ORDER — IPRATROPIUM-ALBUTEROL 0.5-2.5 (3) MG/3ML IN SOLN
3.0000 mL | Freq: Once | RESPIRATORY_TRACT | Status: AC
  Administered 2017-06-18: 3 mL via RESPIRATORY_TRACT
  Filled 2017-06-18: qty 3

## 2017-06-18 MED ORDER — BENZONATATE 100 MG PO CAPS: 100.0000 mg | ORAL_CAPSULE | Freq: Three times a day (TID) | ORAL | 0 refills | Status: DC

## 2017-06-18 MED ORDER — ALBUTEROL SULFATE HFA 108 (90 BASE) MCG/ACT IN AERS
2.0000 | INHALATION_SPRAY | RESPIRATORY_TRACT | Status: DC | PRN
  Administered 2017-06-18: 2 via RESPIRATORY_TRACT
  Filled 2017-06-18: qty 6.7

## 2017-06-18 NOTE — ED Provider Notes (Signed)
[facility] EMERGENCY DEPARTMENT Provider Note   CSN: [id number] Arrival date & time: 06/18/17  1802     History   Chief Complaint Chief Complaint  Patient presents with  . wants pregnancy test  . URI    HPI Erica Harrell is a 22 y.o. female with hx of asthma who presents to the ED for URI symptoms that started about 4 days ago. She reports coughing up green sputum. Patient denies fever or chills. She does not currently have an inhaler.   HPI  Past Medical History:  Diagnosis Date  . Asthma   . Bacterial vaginosis   . Constipation   . Depression   . Headache(784.0)   . Pelvic inflammatory disease   . Seasonal allergies     Patient Active Problem List   Diagnosis Date Noted  . Post term pregnancy, 41 weeks 05/26/2015    Past Surgical History:  Procedure Laterality Date  . CESAREAN SECTION    . CESAREAN SECTION N/A 05/27/2015   Procedure: CESAREAN SECTION;  Surgeon: Adam Phenix, MD;  Location: [facility];  Service: Obstetrics;  Laterality: N/A;  . VAGINAL DELIVERY       OB History    Gravida  3   Para  2   Term  2   Preterm      AB  1   Living  2     SAB  1   TAB      Ectopic      Multiple  0   Live Births  2        Obstetric Comments  Elective C/S         Home Medications    Prior to Admission medications   Medication Sig Start Date End Date Taking? Authorizing Provider  albuterol (VENTOLIN HFA) 108 (90 Base) MCG/ACT inhaler Inhale 1-2 puffs into the lungs every 6 (six) hours as needed for wheezing or shortness of breath.    [provider]  benzonatate (TESSALON) 100 MG capsule Take 1 capsule (100 mg total) by mouth every 8 (eight) hours. 06/18/17   Janne Napoleon, NP  metroNIDAZOLE (FLAGYL) 500 MG tablet Take 1 tablet (500 mg total) by mouth 2 (two) times daily. 01/25/17   Khatri, Hina, PA-C  ondansetron (ZOFRAN ODT) 4 MG disintegrating tablet Take 1 tablet (4 mg total) by mouth every 8 (eight) hours as  needed for nausea or vomiting. 03/27/16   Street, [place], PA-C  predniSONE (DELTASONE) 50 MG tablet Take one tablet PO daily 06/18/17   Janne Napoleon, NP  ranitidine (ZANTAC) 150 MG tablet Take 1 tablet (150 mg total) by mouth 2 (two) times daily. 03/27/16   Muthersbaugh, Dahlia Client, PA-C    Family History Family History  Problem Relation Age of Onset  . Asthma Mother   . Diabetes Maternal Aunt   . Diabetes Maternal Uncle   . Hypertension Maternal Grandmother   . Diabetes Maternal Grandmother   . Asthma Maternal Grandmother     Social History Social History   Tobacco Use  . Smoking status: Never Smoker  . Smokeless tobacco: Never Used  Substance Use Topics  . Alcohol use: No  . Drug use: No     Allergies   Shellfish allergy and Zoloft [sertraline hcl]   Review of Systems Review of Systems  Constitutional: Positive for chills and fever.  HENT: Positive for congestion, sinus pressure and sore throat. Negative for ear pain and trouble swallowing.   Eyes: Negative for  redness.  Respiratory: Positive for cough and wheezing.   Gastrointestinal: Negative for abdominal pain, nausea and vomiting.  Genitourinary: Negative for dysuria, pelvic pain and vaginal bleeding.  Musculoskeletal: Positive for myalgias. Negative for neck stiffness.  Skin: Negative for rash.  Neurological: Negative for headaches.  Hematological: Negative for adenopathy.  Psychiatric/Behavioral: Negative for confusion.     Physical Exam Updated Vital Signs BP 122/64 (BP Location: Right Arm)   Pulse 90   Temp 98.9 F (37.2 C) (Oral)   Resp 18   Ht  (1.6 m)   Wt 71.7 kg (158 lb)   LMP 04/21/2017 (Approximate)   SpO2 100%   BMI 27.99 kg/m   Physical Exam  Constitutional: No distress.  obese  HENT:  Head: Normocephalic and atraumatic.  Mouth/Throat: Uvula is midline and oropharynx is clear and moist.  Eyes: Pupils are equal, round, and reactive to light. Conjunctivae and EOM are normal.  Neck:  Normal range of motion. Neck supple.  Cardiovascular: Normal rate and regular rhythm.  Pulmonary/Chest: Effort normal. She has decreased breath sounds. Wheezes: occasional. She has no rales.  Abdominal: Soft. Bowel sounds are normal. There is no tenderness.  Musculoskeletal: Normal range of motion.  Lymphadenopathy:    She has no cervical adenopathy.  Neurological: She is alert.  Skin: Skin is warm and dry.  Psychiatric: She has a normal mood and affect. Her behavior is normal.  Nursing note and vitals reviewed.    ED Treatments / Results  Labs (all labs ordered are listed, but only abnormal results are displayed) Labs Reviewed  I-STAT BETA HCG BLOOD, ED ([facility], [facility], [facility] ONLY)    Radiology Dg Chest 2 View  Result Date: 06/18/2017 CLINICAL DATA:  Cough and dyspnea with fever x3 days. EXAM: CHEST - 2 VIEW COMPARISON:  None. FINDINGS: The heart size and mediastinal contours are within normal limits. Both lungs are clear. The visualized skeletal structures are unremarkable. IMPRESSION: No active cardiopulmonary disease. Electronically Signed   By: Tollie Eth M.D.   On: 06/18/2017 21:42    Procedures Procedures (including critical care time)  Medications Ordered in ED Medications  albuterol (PROVENTIL HFA;VENTOLIN HFA) 108 (90 Base) MCG/ACT inhaler 2 puff (2 puffs Inhalation Provided for home use 06/18/17 2228)  ipratropium-albuterol (DUONEB) 0.5-2.5 (3) MG/3ML nebulizer solution 3 mL (3 mLs Nebulization Given 06/18/17 2025)  22 y.o. female with hx of asthma here today with cough and wheezing. Patient stable for d/c without respiratory distress. CXR normal. Patient O2 saturations maintained >90, no current signs of respiratory distress. Lung exam improved after nebulizer treatment. Inhaler given to patient prior to d/c. Rx Prednisone 5 day burst. Pt states they are breathing at baseline. Pt has been instructed to continue using prescribed medications and to speak with PCP about today's  exacerbation.   Initial Impression / Assessment and Plan / ED Course  I have reviewed the triage vital signs and the nursing notes.  Final Clinical Impressions(s) / ED Diagnoses   Final diagnoses:  Mild intermittent asthmatic bronchitis without complication    ED Discharge Orders        Ordered    predniSONE (DELTASONE) 50 MG tablet     06/18/17 2214    benzonatate (TESSALON) 100 MG capsule  Every 8 hours     06/18/17 2214       Kerrie [place], NP 06/19/17 0129    Gerhard Munch, MD 06/19/17 [phone]

## 2017-06-18 NOTE — ED Triage Notes (Signed)
Reports having URI symptoms for 3-4 days and wants blood test for pregnancy.  Thinks she is about 2 months pregnant.

## 2017-06-18 NOTE — Discharge Instructions (Addendum)
Use the inhaler 2 puffs ever 4 hours as needed. Follow up with your doctor. Return here for worsening symptoms.

## 2017-09-11 ENCOUNTER — Ambulatory Visit (HOSPITAL_COMMUNITY)
Admission: EM | Admit: 2017-09-11 | Discharge: 2017-09-11 | Disposition: A | Discharge status: Home / Self Care | Payer: Medicaid Other | Attending: Family Medicine | Admitting: Family Medicine

## 2017-09-11 ENCOUNTER — Ambulatory Visit (INDEPENDENT_AMBULATORY_CARE_PROVIDER_SITE_OTHER): Payer: Medicaid Other

## 2017-09-11 ENCOUNTER — Encounter (HOSPITAL_COMMUNITY): Payer: Self-pay | Admitting: Emergency Medicine

## 2017-09-11 DIAGNOSIS — S61421A Laceration with foreign body of right hand, initial encounter: Secondary | ICD-10-CM

## 2017-09-11 DIAGNOSIS — W25XXXA Contact with sharp glass, initial encounter: Secondary | ICD-10-CM | POA: Diagnosis not present

## 2017-09-11 DIAGNOSIS — Z3202 Encounter for pregnancy test, result negative: Secondary | ICD-10-CM | POA: Diagnosis not present

## 2017-09-11 DIAGNOSIS — M79641 Pain in right hand: Secondary | ICD-10-CM | POA: Diagnosis not present

## 2017-09-11 LAB — POCT PREGNANCY, URINE: Preg Test, Ur: NEGATIVE

## 2017-09-11 MED ORDER — MUPIROCIN 2 % EX OINT: 1.0000 "application " | TOPICAL_OINTMENT | Freq: Two times a day (BID) | CUTANEOUS | 0 refills | Status: DC

## 2017-09-11 NOTE — Discharge Instructions (Signed)
WOUND CARE  Keep area clean and dry for 24 hours. Do not remove bandage, if applied.  After 24 hours, remove bandage and wash wound gently with mild soap and warm water. Reapply a new bandage after cleaning wound, if directed.  Continue daily cleansing with soap and water until stitches/staples are removed.  Notify the office if you experience any of the following signs of infection: Swelling, redness, pus drainage, streaking, fever >101.0 F  Notify the office if you experience excessive bleeding that does not stop after 15-20 minutes of constant, firm pressure.  There does appear to be a small piece of glass in her hand, if you have persistent pain or if this is to continue to affect movement of her fingers, please follow-up with hand.

## 2017-09-11 NOTE — ED Triage Notes (Signed)
PT has laceration to right hand that occurred Saturday night. PT cut it on a beer bottle, PT believes glass may still be in her hand.

## 2017-09-11 NOTE — ED Provider Notes (Addendum)
[facility]    CSN: [id number] Arrival date & time: 09/11/17  4098     History   Chief Complaint Chief Complaint  Patient presents with  . Laceration    HPI Keyry Iracheta is a 22 y.o. female nocturia past medical history presenting today for evaluation of right hand injury.  Patient states that she cut her hand on a beer bottle Saturday night.  She is concerned about possible glass being in her hand.  She states that she has had a lot of pain and difficulty moving her third through fifth finger.  She denies numbness or tingling.  HPI  Past Medical History:  Diagnosis Date  . Asthma   . Bacterial vaginosis   . Constipation   . Depression   . Headache(784.0)   . Pelvic inflammatory disease   . Seasonal allergies     Patient Active Problem List   Diagnosis Date Noted  . Post term pregnancy, 41 weeks 05/26/2015    Past Surgical History:  Procedure Laterality Date  . CESAREAN SECTION    . CESAREAN SECTION N/A 05/27/2015   Procedure: CESAREAN SECTION;  Surgeon: Adam Phenix, MD;  Location: [facility];  Service: Obstetrics;  Laterality: N/A;  . VAGINAL DELIVERY      OB History    Gravida  3   Para  2   Term  2   Preterm      AB  1   Living  2     SAB  1   TAB      Ectopic      Multiple  0   Live Births  2        Obstetric Comments  Elective C/S         Home Medications    Prior to Admission medications   Medication Sig Start Date End Date Taking? Authorizing Provider  albuterol (VENTOLIN HFA) 108 (90 Base) MCG/ACT inhaler Inhale 1-2 puffs into the lungs every 6 (six) hours as needed for wheezing or shortness of breath.    [provider]  benzonatate (TESSALON) 100 MG capsule Take 1 capsule (100 mg total) by mouth every 8 (eight) hours. 06/18/17   Janne Napoleon, NP  metroNIDAZOLE (FLAGYL) 500 MG tablet Take 1 tablet (500 mg total) by mouth 2 (two) times daily. 01/25/17   Khatri, Hina, PA-C  mupirocin ointment (BACTROBAN)  2 % Apply 1 application topically 2 (two) times daily. 09/11/17   Gaylin Osoria C, PA-C  ondansetron (ZOFRAN ODT) 4 MG disintegrating tablet Take 1 tablet (4 mg total) by mouth every 8 (eight) hours as needed for nausea or vomiting. 03/27/16   Street, [place], PA-C  predniSONE (DELTASONE) 50 MG tablet Take one tablet PO daily 06/18/17   Janne Napoleon, NP  ranitidine (ZANTAC) 150 MG tablet Take 1 tablet (150 mg total) by mouth 2 (two) times daily. 03/27/16   Muthersbaugh, Dahlia Client, PA-C    Family History Family History  Problem Relation Age of Onset  . Asthma Mother   . Diabetes Maternal Aunt   . Diabetes Maternal Uncle   . Hypertension Maternal Grandmother   . Diabetes Maternal Grandmother   . Asthma Maternal Grandmother     Social History Social History   Tobacco Use  . Smoking status: Never Smoker  . Smokeless tobacco: Never Used  Substance Use Topics  . Alcohol use: No  . Drug use: No     Allergies   Shellfish allergy and Zoloft [sertraline hcl]  Review of Systems Review of Systems  Constitutional: Negative for fatigue and fever.  HENT: Negative for mouth sores.   Eyes: Negative for visual disturbance.  Respiratory: Negative for shortness of breath.   Cardiovascular: Negative for chest pain.  Gastrointestinal: Negative for abdominal pain, nausea and vomiting.  Genitourinary: Negative for genital sores.  Musculoskeletal: Positive for arthralgias, joint swelling and myalgias.  Skin: Positive for color change and wound. Negative for rash.  Neurological: Negative for dizziness, weakness, light-headedness and headaches.     Physical Exam Triage Vital Signs ED Triage Vitals [09/11/17 1031]  Enc Vitals Group     BP      Pulse      Resp      Temp      Temp src      SpO2      Weight      Height      Head Circumference      Peak Flow      Pain Score 4     Pain Loc      Pain Edu?      Excl. in GC?    No data found.  Updated Vital Signs LMP 08/02/2017    Visual Acuity Right Eye Distance:   Left Eye Distance:   Bilateral Distance:    Right Eye Near:   Left Eye Near:    Bilateral Near:     Physical Exam  Constitutional: She is oriented to person, place, and time. She appears well-developed and well-nourished.  No acute distress  HENT:  Head: Normocephalic and atraumatic.  Nose: Nose normal.  Eyes: Conjunctivae are normal.  Neck: Neck supple.  Cardiovascular: Normal rate.  Pulmonary/Chest: Effort normal. No respiratory distress.  Abdominal: She exhibits no distension.  Musculoskeletal: Normal range of motion.  Limited range of motion with third through fifth fingers at the distal MCP joint, full active range of motion of index finger and thumb.  Neurological: She is alert and oriented to person, place, and time.  Skin: Skin is warm and dry.  Right hand with half-circle laceration to hyperthenar eminence on palmar surface, superficial lacerations in between fourth and fifth finger, significant swelling to distal MCPs palmar surface  Psychiatric: She has a normal mood and affect.  Nursing note and vitals reviewed.    UC Treatments / Results  Labs (all labs ordered are listed, but only abnormal results are displayed) Labs Reviewed  POCT PREGNANCY, URINE    EKG None  Radiology Dg Hand Complete Right  Result Date: 09/11/2017 CLINICAL DATA:  Cut hand on broken bottle with increased swelling and decreased mobility, initial encounter EXAM: RIGHT HAND - COMPLETE 3+ VIEW COMPARISON:  None. FINDINGS: No acute fracture or dislocation is noted. There is a linear radiopaque density identified in the palmar soft tissues of the hand in the region of the fourth metacarpal. This is likely related to a small glass shard. IMPRESSION: Changes consistent with a small glass shard within the palmar soft tissues of the hand as described. No acute bony abnormality is noted. Electronically Signed   By: Alcide Clever M.D.   On: 09/11/2017 11:13     Procedures Procedures (including critical care time) Wound was soaked and cleansed with Hibiclens, irrigated with sterile water,re-wrapped. Medications Ordered in UC Medications - No data to display  Initial Impression / Assessment and Plan / UC Course  I have reviewed the triage vital signs and the nursing notes.  Pertinent labs & imaging results that were available during my  care of the patient were reviewed by me and considered in my medical decision making (see chart for details).     X-ray noting possible glass shard and palmar soft tissue, consistent with the location of the laceration to the palmar surface of her hand, there also appears to be a speck appearance in between fourth and fifth fingers that could also represent another piece.  Given the size of these, feel exploration for removal would cause more pain and damage rather than seeing if pieces work their way out.  Her tetanus was last updated a couple years ago.  Will provide Bactroban to use to help prevent infection, follow-up if developing signs of infection.  Follow-up with hand if not regaining movement of fingers.Discussed strict return precautions. Patient verbalized understanding and is agreeable with plan.  Final Clinical Impressions(s) / UC Diagnoses   Final diagnoses:  Laceration of right hand with foreign body, initial encounter     Discharge Instructions     WOUND CARE . Keep area clean and dry for 24 hours. Do not remove bandage, if applied. . After 24 hours, remove bandage and wash wound gently with mild soap and warm water. Reapply a new bandage after cleaning wound, if directed. . Continue daily cleansing with soap and water until stitches/staples are removed. . Notify the office if you experience any of the following signs of infection: Swelling, redness, pus drainage, streaking, fever >101.0 F . Notify the office if you experience excessive bleeding that does not stop after 15-20 minutes of  constant, firm pressure.  There does appear to be a small piece of glass in her hand, if you have persistent pain or if this is to continue to affect movement of her fingers, please follow-up with hand.     ED Prescriptions    Medication Sig Dispense Auth. Provider   mupirocin ointment (BACTROBAN) 2 % Apply 1 application topically 2 (two) times daily. 22 g Marissah Vandemark, [place]Hallie C, PA-C     Controlled Substance Prescriptions [place] Controlled Substance Registry consulted? Not Applicable   Lew DawesWieters, Makenah Karas C, PA-C 09/11/17 [address]    Vaughan Garfinkle, NambeHallie C, [place]-C 09/11/17 1148

## 2017-12-09 ENCOUNTER — Other Ambulatory Visit: Payer: Self-pay

## 2017-12-09 ENCOUNTER — Ambulatory Visit (HOSPITAL_COMMUNITY)
Admission: EM | Admit: 2017-12-09 | Discharge: 2017-12-09 | Disposition: A | Discharge status: Home / Self Care | Payer: Medicaid Other | Attending: Internal Medicine | Admitting: Internal Medicine

## 2017-12-09 ENCOUNTER — Encounter (HOSPITAL_COMMUNITY): Payer: Self-pay | Admitting: Emergency Medicine

## 2017-12-09 DIAGNOSIS — R300 Dysuria: Secondary | ICD-10-CM

## 2017-12-09 DIAGNOSIS — N898 Other specified noninflammatory disorders of vagina: Secondary | ICD-10-CM | POA: Diagnosis not present

## 2017-12-09 DIAGNOSIS — M546 Pain in thoracic spine: Secondary | ICD-10-CM | POA: Insufficient documentation

## 2017-12-09 DIAGNOSIS — R1030 Lower abdominal pain, unspecified: Secondary | ICD-10-CM | POA: Insufficient documentation

## 2017-12-09 DIAGNOSIS — R319 Hematuria, unspecified: Secondary | ICD-10-CM

## 2017-12-09 DIAGNOSIS — Z3202 Encounter for pregnancy test, result negative: Secondary | ICD-10-CM

## 2017-12-09 LAB — POCT URINALYSIS DIP (DEVICE)
BILIRUBIN URINE: NEGATIVE
GLUCOSE, UA: NEGATIVE mg/dL
KETONES UR: NEGATIVE mg/dL
Nitrite: POSITIVE — AB
Protein, ur: 100 mg/dL — AB
SPECIFIC GRAVITY, URINE: 1.02 (ref 1.005–1.030)
UROBILINOGEN UA: 1 mg/dL (ref 0.0–1.0)
pH: 7 (ref 5.0–8.0)

## 2017-12-09 MED ORDER — NITROFURANTOIN MONOHYD MACRO 100 MG PO CAPS: 100.0000 mg | ORAL_CAPSULE | Freq: Two times a day (BID) | ORAL | 0 refills | Status: AC

## 2017-12-09 NOTE — ED Triage Notes (Signed)
Blood in urine for a week.  Patient has abdominal pain and back pain and painful with urination

## 2017-12-09 NOTE — Discharge Instructions (Signed)
Urine showed evidence of infection. We are treating you with macrobid- twice daily for 5 days. Be sure to take full course. Stay hydrated- urine should be pale yellow to clear. May use azo for relief of burning while infection is being cleared (this changes the color of your urine).   Please return or follow up with your primary provider if symptoms not improving with treatment. Please return sooner if you have worsening of symptoms or develop fever, nausea, vomiting, abdominal pain, back pain, lightheadedness, dizziness.

## 2017-12-10 NOTE — ED Provider Notes (Signed)
[facility]    CSN: [id number] Arrival date & time: 12/09/17  1042     History   Chief Complaint Chief Complaint  Patient presents with  . Hematuria    HPI Erica Harrell is a 23 y.o. female history of asthma, bacterial vaginosis presenting today for evaluation of hematuria.  Patient states that she has noticed blood after urinating.  She has also developed some dysuria, lower abdominal pressure and mild back pain.  Symptoms have been going on for approximately 1 week.  She has had a small amount of vaginal discharge, but denies itching or irritation.  Last menstrual period was 10/10.  No form of birth control.  Denies fever, nausea, vomiting.  HPI  Past Medical History:  Diagnosis Date  . Asthma   . Bacterial vaginosis   . Constipation   . Depression   . Headache(784.0)   . Pelvic inflammatory disease   . Seasonal allergies     Patient Active Problem List   Diagnosis Date Noted  . Post term pregnancy, 41 weeks 05/26/2015    Past Surgical History:  Procedure Laterality Date  . CESAREAN SECTION    . CESAREAN SECTION N/A 05/27/2015   Procedure: CESAREAN SECTION;  Surgeon: Adam Phenix, MD;  Location: [facility];  Service: Obstetrics;  Laterality: N/A;  . VAGINAL DELIVERY      OB History    Gravida  3   Para  2   Term  2   Preterm      AB  1   Living  2     SAB  1   TAB      Ectopic      Multiple  0   Live Births  2        Obstetric Comments  Elective C/S         Home Medications    Prior to Admission medications   Medication Sig Start Date End Date Taking? Authorizing Provider  nitrofurantoin, macrocrystal-monohydrate, (MACROBID) 100 MG capsule Take 1 capsule (100 mg total) by mouth 2 (two) times daily for 5 days. 12/09/17 12/14/17  Mara Favero, Junius Creamer, PA-C    Family History Family History  Problem Relation Age of Onset  . Asthma Mother   . Diabetes Maternal Aunt   . Diabetes Maternal Uncle   . Hypertension Maternal  Grandmother   . Diabetes Maternal Grandmother   . Asthma Maternal Grandmother     Social History Social History   Tobacco Use  . Smoking status: Never Smoker  . Smokeless tobacco: Never Used  Substance Use Topics  . Alcohol use: No  . Drug use: No     Allergies   Shellfish allergy and Zoloft [sertraline hcl]   Review of Systems Review of Systems  Constitutional: Negative for fever.  Respiratory: Negative for shortness of breath.   Cardiovascular: Negative for chest pain.  Gastrointestinal: Negative for abdominal pain, diarrhea, nausea and vomiting.  Genitourinary: Positive for dysuria, frequency and hematuria. Negative for flank pain, genital sores, menstrual problem, vaginal bleeding, vaginal discharge and vaginal pain.  Musculoskeletal: Negative for back pain.  Skin: Negative for rash.  Neurological: Negative for dizziness, light-headedness and headaches.     Physical Exam Triage Vital Signs ED Triage Vitals  Enc Vitals Group     BP 12/09/17 1205 123/81     Pulse Rate 12/09/17 1205 66     Resp 12/09/17 1205 18     Temp 12/09/17 1205 98.1 F (36.7 C)  Temp Source 12/09/17 1205 Oral     SpO2 12/09/17 1205 100 %     Weight --      Height --      Head Circumference --      Peak Flow --      Pain Score 12/09/17 1203 10     Pain Loc --      Pain Edu? --      Excl. in GC? --    No data found.  Updated Vital Signs BP 123/81 (BP Location: Right Arm)   Pulse 66   Temp 98.1 F (36.7 C) (Oral)   Resp 18   LMP 11/30/2017   SpO2 100%   Visual Acuity Right Eye Distance:   Left Eye Distance:   Bilateral Distance:    Right Eye Near:   Left Eye Near:    Bilateral Near:     Physical Exam  Constitutional: She appears well-developed and well-nourished. No distress.  HENT:  Head: Normocephalic and atraumatic.  Eyes: Conjunctivae are normal.  Neck: Neck supple.  Cardiovascular: Normal rate and regular rhythm.  No murmur heard. Pulmonary/Chest: Effort  normal and breath sounds normal. No respiratory distress.  Abdominal: Soft. There is no tenderness.  No CVA tenderness  Musculoskeletal: She exhibits no edema.  Neurological: She is alert.  Skin: Skin is warm and dry.  Psychiatric: She has a normal mood and affect.  Nursing note and vitals reviewed.    UC Treatments / Results  Labs (all labs ordered are listed, but only abnormal results are displayed) Labs Reviewed  POCT URINALYSIS DIP (DEVICE) - Abnormal; Notable for the following components:      Result Value   Hgb urine dipstick LARGE (*)    Protein, ur 100 (*)    Nitrite POSITIVE (*)    Leukocytes, UA MODERATE (*)    All other components within normal limits  URINE CULTURE    EKG None  Radiology No results found.  Procedures Procedures (including critical care time)  Medications Ordered in UC Medications - No data to display  Initial Impression / Assessment and Plan / UC Course  I have reviewed the triage vital signs and the nursing notes.  Pertinent labs & imaging results that were available during my care of the patient were reviewed by me and considered in my medical decision making (see chart for details).     Patient with positive nitrites, moderate leuks, will treat for UTI with Macrobid twice daily x5 days.  Vital signs stable, no CVA tenderness, unlikely pyelonephritis.  Push fluids.  Culture sent.Discussed strict return precautions. Patient verbalized understanding and is agreeable with plan.  Final Clinical Impressions(s) / UC Diagnoses   Final diagnoses:  Dysuria     Discharge Instructions     Urine showed evidence of infection. We are treating you with macrobid- twice daily for 5 days. Be sure to take full course. Stay hydrated- urine should be pale yellow to clear. May use azo for relief of burning while infection is being cleared (this changes the color of your urine).   Please return or follow up with your primary provider if symptoms not  improving with treatment. Please return sooner if you have worsening of symptoms or develop fever, nausea, vomiting, abdominal pain, back pain, lightheadedness, dizziness.   ED Prescriptions    Medication Sig Dispense Auth. Provider   nitrofurantoin, macrocrystal-monohydrate, (MACROBID) 100 MG capsule Take 1 capsule (100 mg total) by mouth 2 (two) times daily for 5 days. 10 capsule  Flora Ratz, [place] C, PA-C     Controlled Substance Prescriptions [place] Controlled Substance Registry consulted? Not Applicable   Lew Dawes, [place] 12/10/17 2130

## 2017-12-11 ENCOUNTER — Telehealth: Payer: Self-pay | Admitting: Emergency Medicine

## 2017-12-11 LAB — URINE CULTURE: Culture: >=100000 — AB

## 2017-12-11 LAB — POCT PREGNANCY, URINE: PREG TEST UR: NEGATIVE

## 2017-12-11 NOTE — Telephone Encounter (Signed)
Called patient at number in chart.  Number is not a working number. Mailing letter. nmw

## 2018-07-03 ENCOUNTER — Other Ambulatory Visit: Payer: Self-pay

## 2018-07-03 ENCOUNTER — Encounter (HOSPITAL_COMMUNITY): Payer: Self-pay | Admitting: Family Medicine

## 2018-07-03 ENCOUNTER — Ambulatory Visit (HOSPITAL_COMMUNITY)
Admission: EM | Admit: 2018-07-03 | Discharge: 2018-07-03 | Disposition: A | Discharge status: Home / Self Care | Payer: Medicaid Other | Attending: Family Medicine | Admitting: Family Medicine

## 2018-07-03 DIAGNOSIS — W57XXXA Bitten or stung by nonvenomous insect and other nonvenomous arthropods, initial encounter: Secondary | ICD-10-CM

## 2018-07-03 DIAGNOSIS — S50861A Insect bite (nonvenomous) of right forearm, initial encounter: Secondary | ICD-10-CM

## 2018-07-03 MED ORDER — TRAMADOL HCL 50 MG PO TABS: 50.0000 mg | ORAL_TABLET | Freq: Two times a day (BID) | ORAL | 0 refills | Status: AC | PRN

## 2018-07-03 MED ORDER — DOXYCYCLINE HYCLATE 100 MG PO CAPS: 100.0000 mg | ORAL_CAPSULE | Freq: Two times a day (BID) | ORAL | 0 refills | Status: DC

## 2018-07-03 NOTE — ED Provider Notes (Signed)
[facility]    CSN: [id number] Arrival date & time: 07/03/18  1526     History   Chief Complaint Chief Complaint  Patient presents with  . Insect Bite    HPI Ala DachJoneshia Moten is a 23 y.o. female.   Pt is a 23 year old female that presents with insect bite to the right forearm. This has been present and worsening over the last week. There is swelling, drainage and tenderness. No fever. She has been cleaning with peroxide. Denies any itching. No hx of MRSA.   ROS per HPI      Past Medical History:  Diagnosis Date  . Asthma   . Bacterial vaginosis   . Constipation   . Depression   . Headache(784.0)   . Pelvic inflammatory disease   . Seasonal allergies     Patient Active Problem List   Diagnosis Date Noted  . Post term pregnancy, 41 weeks 05/26/2015    Past Surgical History:  Procedure Laterality Date  . CESAREAN SECTION    . CESAREAN SECTION N/A 05/27/2015   Procedure: CESAREAN SECTION;  Surgeon: Adam PhenixJames G Arnold, MD;  Location: [facility];  Service: Obstetrics;  Laterality: N/A;  . VAGINAL DELIVERY      OB History    Gravida  3   Para  2   Term  2   Preterm      AB  1   Living  2     SAB  1   TAB      Ectopic      Multiple  0   Live Births  2        Obstetric Comments  Elective C/S         Home Medications    Prior to Admission medications   Medication Sig Start Date End Date Taking? Authorizing Provider  doxycycline (VIBRAMYCIN) 100 MG capsule Take 1 capsule (100 mg total) by mouth 2 (two) times daily. 07/03/18   Dahlia ByesBast, Fran Neiswonger A, NP  traMADol (ULTRAM) 50 MG tablet Take 1 tablet (50 mg total) by mouth every 12 (twelve) hours as needed for up to 3 days. 07/03/18 07/06/18  Janace ArisBast, Zuley Lutter A, NP    Family History Family History  Problem Relation Age of Onset  . Asthma Mother   . Diabetes Maternal Aunt   . Diabetes Maternal Uncle   . Hypertension Maternal Grandmother   . Diabetes Maternal Grandmother   . Asthma Maternal  Grandmother     Social History Social History   Tobacco Use  . Smoking status: Never Smoker  . Smokeless tobacco: Never Used  Substance Use Topics  . Alcohol use: No  . Drug use: No     Allergies   Shellfish allergy and Zoloft [sertraline hcl]   Review of Systems Review of Systems   Physical Exam Triage Vital Signs ED Triage Vitals [07/03/18 1535]  Enc Vitals Group     BP 106/68     Pulse Rate 86     Resp 18     Temp 99 F (37.2 C)     Temp src      SpO2 99 %     Weight      Height      Head Circumference      Peak Flow      Pain Score 5     Pain Loc      Pain Edu?      Excl. in GC?    No data found.  Updated Vital Signs BP 106/68   Pulse 86   Temp 99 F (37.2 C)   Resp 18   LMP 07/03/2018   SpO2 99%   Visual Acuity Right Eye Distance:   Left Eye Distance:   Bilateral Distance:    Right Eye Near:   Left Eye Near:    Bilateral Near:     Physical Exam Vitals signs and nursing note reviewed.  Constitutional:      General: She is not in acute distress.    Appearance: Normal appearance. She is not ill-appearing, toxic-appearing or diaphoretic.  HENT:     Head: Normocephalic and atraumatic.     Nose: Nose normal.  Eyes:     Conjunctiva/sclera: Conjunctivae normal.  Pulmonary:     Effort: Pulmonary effort is normal.  Musculoskeletal: Normal range of motion.        General: Swelling and tenderness present. No deformity or signs of injury.  Skin:    General: Skin is warm and dry.     Comments: See picture for detail  Neurological:     Mental Status: She is alert.  Psychiatric:        Mood and Affect: Mood normal.            UC Treatments / Results  Labs (all labs ordered are listed, but only abnormal results are displayed) Labs Reviewed - No data to display  EKG None  Radiology No results found.  Procedures Procedures (including critical care time)  Medications Ordered in UC Medications - No data to display  Initial  Impression / Assessment and Plan / UC Course  I have reviewed the triage vital signs and the nursing notes.  Pertinent labs & imaging results that were available during my care of the patient were reviewed by me and considered in my medical decision making (see chart for details).     Symptoms consistent with some sort of insect bite that has become infected.  Will treat with doxycycline.  Tramadol for pain.  Instructed to keep a close watch and if her symptoms worsen she will need to come back for recheck  Pt understanding and agreed.  Final Clinical Impressions(s) / UC Diagnoses   Final diagnoses:  Insect bite of right forearm, initial encounter     Discharge Instructions     We are treating you for infection.  Take the medication as prescribed.  I am giving you some stronger pain medicine to use as needed for pain.  Be aware this medication may make you drowsy. Please keep a close watch on this and if it becomes worse over the next 24 to 48 hours you need to follow-up for recheck Use warm compresses as instructed     ED Prescriptions    Medication Sig Dispense Auth. Provider   doxycycline (VIBRAMYCIN) 100 MG capsule Take 1 capsule (100 mg total) by mouth 2 (two) times daily. 20 capsule Jodye Scali A, NP   traMADol (ULTRAM) 50 MG tablet Take 1 tablet (50 mg total) by mouth every 12 (twelve) hours as needed for up to 3 days. 6 tablet Dahlia Byes A, NP     Controlled Substance Prescriptions [place] Controlled Substance Registry consulted? Not Applicable   Janace Aris, NP 07/04/18 [phone]

## 2018-07-03 NOTE — Discharge Instructions (Addendum)
We are treating you for infection.  Take the medication as prescribed.  I am giving you some stronger pain medicine to use as needed for pain.  Be aware this medication may make you drowsy. Please keep a close watch on this and if it becomes worse over the next 24 to 48 hours you need to follow-up for recheck Use warm compresses as instructed

## 2018-07-03 NOTE — ED Triage Notes (Signed)
Pt states she thinks she was bitten by something on her R forearm, unsure what, states its getting bigger and bigger. First noticed it a week ago.

## 2018-12-17 ENCOUNTER — Other Ambulatory Visit: Payer: Self-pay

## 2018-12-17 ENCOUNTER — Emergency Department (HOSPITAL_COMMUNITY)
Admission: EM | Admit: 2018-12-17 | Discharge: 2018-12-17 | Disposition: A | Discharge status: Home / Self Care | Payer: Medicaid Other | Attending: Emergency Medicine | Admitting: Emergency Medicine

## 2018-12-17 DIAGNOSIS — R1030 Lower abdominal pain, unspecified: Secondary | ICD-10-CM | POA: Diagnosis not present

## 2018-12-17 DIAGNOSIS — Z5321 Procedure and treatment not carried out due to patient leaving prior to being seen by health care provider: Secondary | ICD-10-CM | POA: Insufficient documentation

## 2018-12-17 LAB — POC URINE PREG, ED
Preg Test, Ur: NEGATIVE
Preg Test, Ur: NEGATIVE

## 2018-12-17 NOTE — ED Triage Notes (Addendum)
Pt requesting to be evaluated for possible pregnancy. DG test last night was positive. Pt reports lower abd discomfort. Denies vaginal bleeding

## 2018-12-17 NOTE — ED Notes (Signed)
Pt wanting to go home, [facility]

## 2018-12-24 ENCOUNTER — Encounter (HOSPITAL_COMMUNITY): Payer: Self-pay

## 2018-12-24 ENCOUNTER — Ambulatory Visit (HOSPITAL_COMMUNITY)
Admission: EM | Admit: 2018-12-24 | Discharge: 2018-12-24 | Disposition: A | Discharge status: Home / Self Care | Payer: Medicaid Other | Attending: Emergency Medicine | Admitting: Emergency Medicine

## 2018-12-24 ENCOUNTER — Other Ambulatory Visit: Payer: Self-pay

## 2018-12-24 DIAGNOSIS — Z3201 Encounter for pregnancy test, result positive: Secondary | ICD-10-CM | POA: Diagnosis not present

## 2018-12-24 DIAGNOSIS — Z3A01 Less than 8 weeks gestation of pregnancy: Secondary | ICD-10-CM | POA: Diagnosis not present

## 2018-12-24 LAB — POCT PREGNANCY, URINE: Preg Test, Ur: POSITIVE — AB

## 2018-12-24 NOTE — ED Provider Notes (Addendum)
[facility]    CSN: [id number] Arrival date & time: 12/24/18  1423      History   Chief Complaint Chief Complaint  Patient presents with  . Possible Pregnancy    HPI Erica Harrell is a 23 y.o. female.   Patient presents with request for a pregnancy test.  She states she is late for her period and had a positive pregnancy test at home.  She denies any concerns; she denies n/v, abdominal pain, pelvic pain, vaginal discharge, back pain, dysuria, or other symptoms.  LMP: 11/21/2018.  The history is provided by the patient.    Past Medical History:  Diagnosis Date  . Asthma   . Bacterial vaginosis   . Constipation   . Depression   . Headache(784.0)   . Pelvic inflammatory disease   . Seasonal allergies     Patient Active Problem List   Diagnosis Date Noted  . Post term pregnancy, 41 weeks 05/26/2015    Past Surgical History:  Procedure Laterality Date  . CESAREAN SECTION    . CESAREAN SECTION N/A 05/27/2015   Procedure: CESAREAN SECTION;  Surgeon: Adam Phenix, MD;  Location: [facility];  Service: Obstetrics;  Laterality: N/A;  . VAGINAL DELIVERY      OB History    Gravida  3   Para  2   Term  2   Preterm      AB  1   Living  2     SAB  1   TAB      Ectopic      Multiple  0   Live Births  2        Obstetric Comments  Elective C/S         Home Medications    Prior to Admission medications   Medication Sig Start Date End Date Taking? Authorizing Provider  doxycycline (VIBRAMYCIN) 100 MG capsule Take 1 capsule (100 mg total) by mouth 2 (two) times daily. 07/03/18   Janace Aris, NP    Family History Family History  Problem Relation Age of Onset  . Asthma Mother   . Diabetes Maternal Aunt   . Diabetes Maternal Uncle   . Hypertension Maternal Grandmother   . Diabetes Maternal Grandmother   . Asthma Maternal Grandmother     Social History Social History   Tobacco Use  . Smoking status: Never Smoker  . Smokeless  tobacco: Never Used  Substance Use Topics  . Alcohol use: No  . Drug use: No     Allergies   Shellfish allergy and Zoloft [sertraline hcl]   Review of Systems Review of Systems  Constitutional: Negative for chills and fever.  HENT: Negative for ear pain and sore throat.   Eyes: Negative for pain and visual disturbance.  Respiratory: Negative for cough and shortness of breath.   Cardiovascular: Negative for chest pain and palpitations.  Gastrointestinal: Negative for abdominal pain, nausea and vomiting.  Genitourinary: Negative for dysuria, flank pain, hematuria, pelvic pain, vaginal bleeding and vaginal discharge.  Musculoskeletal: Negative for arthralgias and back pain.  Skin: Negative for color change and rash.  Neurological: Negative for seizures and syncope.  All other systems reviewed and are negative.    Physical Exam Triage Vital Signs ED Triage Vitals  Enc Vitals Group     BP      Pulse      Resp      Temp      Temp src  SpO2      Weight      Height      Head Circumference      Peak Flow      Pain Score      Pain Loc      Pain Edu?      Excl. in [place]?    No data found.  Updated Vital Signs BP 122/71 (BP Location: Left Arm)   Pulse 86   Temp 99.2 F (37.3 C) (Oral)   Resp 15   SpO2 100%   Visual Acuity Right Eye Distance:   Left Eye Distance:   Bilateral Distance:    Right Eye Near:   Left Eye Near:    Bilateral Near:     Physical Exam Vitals signs and nursing note reviewed.  Constitutional:      General: She is not in acute distress.    Appearance: She is well-developed.  HENT:     Head: Normocephalic and atraumatic.     Mouth/Throat:     Mouth: Mucous membranes are moist.     Pharynx: Oropharynx is clear.  Eyes:     Conjunctiva/sclera: Conjunctivae normal.  Neck:     Musculoskeletal: Neck supple.  Cardiovascular:     Rate and Rhythm: Normal rate and regular rhythm.     Heart sounds: No murmur.  Pulmonary:     Effort:  Pulmonary effort is normal. No respiratory distress.     Breath sounds: Normal breath sounds.  Abdominal:     General: Bowel sounds are normal.     Palpations: Abdomen is soft.     Tenderness: There is no abdominal tenderness. There is no guarding or rebound.  Skin:    General: Skin is warm and dry.  Neurological:     Mental Status: She is alert.      UC Treatments / Results  Labs (all labs ordered are listed, but only abnormal results are displayed) Labs Reviewed  POCT PREGNANCY, URINE - Abnormal; Notable for the following components:      Result Value   Preg Test, Ur POSITIVE (*)    All other components within normal limits  POC URINE PREG, ED    EKG   Radiology No results found.  Procedures Procedures (including critical care time)  Medications Ordered in UC Medications - No data to display  Initial Impression / Assessment and Plan / UC Course  I have reviewed the triage vital signs and the nursing notes.  Pertinent labs & imaging results that were available during my care of the patient were reviewed by me and considered in my medical decision making (see chart for details).    4-[redacted] weeks pregnant.  Instructed patient to start taking prenatal vitamins today.  Instructed her to call her OB/GYN or the one suggested to schedule an appointment.  Patient agrees to plan of care.     Final Clinical Impressions(s) / UC Diagnoses   Final diagnoses:  Less than [redacted] weeks gestation of pregnancy     Discharge Instructions     Start taking a prenatal vitamin today.  Call your OB/GYN or the one listed below to schedule an appointment.        ED Prescriptions    None     PDMP not reviewed this encounter.   Sharion Balloon, NP 12/24/18 1509    Sharion Balloon, NP 12/24/18 [phone]

## 2018-12-24 NOTE — ED Triage Notes (Signed)
Patient presents to Urgent Care with complaints of possible pregnancy since missing her period that was supposed to come on at the end of October. Patient reports she has had positive pregnancy tests at home.

## 2018-12-24 NOTE — Discharge Instructions (Addendum)
Start taking a prenatal vitamin today.  Call your OB/GYN or the one listed below to schedule an appointment.

## 2018-12-26 ENCOUNTER — Ambulatory Visit: Payer: Medicaid Other

## 2019-01-03 ENCOUNTER — Telehealth (INDEPENDENT_AMBULATORY_CARE_PROVIDER_SITE_OTHER): Payer: Medicaid Other | Admitting: Family Medicine

## 2019-01-03 DIAGNOSIS — N9489 Other specified conditions associated with female genital organs and menstrual cycle: Secondary | ICD-10-CM

## 2019-01-03 NOTE — Telephone Encounter (Signed)
The patient called in stating she is experiencing stomach pains. She is currently pregnant. She is on the waitlist for a nurse intake.

## 2019-01-04 NOTE — Telephone Encounter (Signed)
Called pt back per her phone call.   Pt reports she is having abdominal pain and reports is it present at night and in mid morning. It is a sharp pain and is located in the center of her abdomen. Pt is taking Tylenol and reports it does helps for a little while.   Pt denies bleeding, discharge or leaking. Pt reports she is drinking well. She denies UTI symptoms.   Discussed uterine stretching and ligament stretching and that some cramping can be normal.   Pt advised that is pain intensifies or is uncontrollable or she is bleeding like a period or heavier, she is to go to the MAU at the [facility] and [place] [facility] (Entrance C) for evaluation. Pt voiced understanding.

## 2019-01-07 ENCOUNTER — Telehealth: Payer: Self-pay | Admitting: Family Medicine

## 2019-01-07 NOTE — Telephone Encounter (Signed)
Attempted to contact patient with her new ob appointments. No answer and unable to leave a message due to the voicemail not being setup. Reminders mailed.

## 2019-01-21 ENCOUNTER — Other Ambulatory Visit: Payer: Self-pay

## 2019-01-21 ENCOUNTER — Ambulatory Visit (INDEPENDENT_AMBULATORY_CARE_PROVIDER_SITE_OTHER): Payer: Medicaid Other | Admitting: *Deleted

## 2019-01-21 DIAGNOSIS — Z98891 History of uterine scar from previous surgery: Secondary | ICD-10-CM | POA: Insufficient documentation

## 2019-01-21 DIAGNOSIS — Z3490 Encounter for supervision of normal pregnancy, unspecified, unspecified trimester: Secondary | ICD-10-CM | POA: Insufficient documentation

## 2019-01-21 MED ORDER — PREPLUS 27-1 MG PO TABS: 1.0000 | ORAL_TABLET | Freq: Every day | ORAL | 6 refills | Status: DC

## 2019-01-21 MED ORDER — BLOOD PRESSURE KIT DEVI: 1.0000 | 0 refills | Status: AC | PRN

## 2019-01-21 NOTE — Patient Instructions (Signed)

## 2019-01-21 NOTE — Progress Notes (Signed)
I connected with  Erica Harrell on 01/21/19 at  1:30 PM EST by telephone and verified that I am speaking with the correct person using two identifiers.   I discussed the limitations, risks, security and privacy concerns of performing an evaluation and management service by telephone and the availability of in person appointments. I also discussed with the patient that there may be a patient responsible charge related to this service. The patient expressed understanding and agreed to proceed. Explained I am completing her New OB Intake today. We discussed Her EDD and that it is based on  sure LMP . I reviewed her allergies, meds, OB History, Medical /Surgical history, and appropriate screenings. I explained I will send her the [employer] app- app sent to her while on phone.- she is unable to download while on phone.   I explained we will send a blood pressure cuff to [facility] that will fill that prescription and they  will call her to verify her information. I asked her to bring the blood pressure cuff with her to her first ob appointment so we can show her how to use it. Explained  then we will have her take her blood pressure weekly and enter into the app. Explained she will have some visits in office and some virtually. She already has MyChart account and cannot download app while on phone; but I gave her instructions on how to download the app.I reviewed her new ob appointment date/ time with her , our location and to wear mask, no visitors. Explained she will have exam, ob bloodwork, hemoglobin a1C, cbg , genetic testing if desired, pap if needed. She states she had a pap earlier this year and I asked her to sign a release for records when she comes to hew new ob appt.  I scheduled an [place] at 19 weeks and gave her the appointment. She voices understanding.   Linda,RN 01/21/2019  1:31 PM

## 2019-02-05 ENCOUNTER — Telehealth: Payer: Medicaid Other | Admitting: Obstetrics and Gynecology

## 2019-02-05 ENCOUNTER — Other Ambulatory Visit: Payer: Self-pay

## 2019-02-05 DIAGNOSIS — Z98891 History of uterine scar from previous surgery: Secondary | ICD-10-CM

## 2019-02-05 DIAGNOSIS — Z3490 Encounter for supervision of normal pregnancy, unspecified, unspecified trimester: Secondary | ICD-10-CM

## 2019-02-05 MED ORDER — METOCLOPRAMIDE HCL 10 MG PO TABS: 10.0000 mg | ORAL_TABLET | Freq: Three times a day (TID) | ORAL | 1 refills | Status: DC

## 2019-02-05 NOTE — Progress Notes (Signed)
Patient was scheduled today for a virtual NEW OB.  [place] mychart set up prior to her visit. Per the NEW OB intake note the patient needs a pap, A1c and genetic screening.  She will be re- scheduled for her NEW OB in person.    Lezlie Lye, NP 02/05/2019 3:26 PM

## 2019-02-18 ENCOUNTER — Telehealth: Payer: Self-pay | Admitting: *Deleted

## 2019-02-18 NOTE — Telephone Encounter (Signed)
Erica Harrell left a voice mail message 02/14/19 at 0953 asking when is her appointment- states doesn't remember.  I called [name] and was unable to leave a message -heard a message voicemail is not set up. Will send MyChart message. Delores Edelstein,RN

## 2019-02-19 ENCOUNTER — Telehealth: Payer: Self-pay | Admitting: Nurse Practitioner

## 2019-02-19 ENCOUNTER — Encounter: Payer: Self-pay | Admitting: Nurse Practitioner

## 2019-02-19 ENCOUNTER — Other Ambulatory Visit: Payer: Self-pay

## 2019-02-19 ENCOUNTER — Ambulatory Visit: Payer: Medicaid Other | Admitting: Nurse Practitioner

## 2019-02-19 DIAGNOSIS — Z91199 Patient's noncompliance with other medical treatment and regimen due to unspecified reason: Secondary | ICD-10-CM

## 2019-02-19 DIAGNOSIS — Z5329 Procedure and treatment not carried out because of patient's decision for other reasons: Secondary | ICD-10-CM

## 2019-02-19 NOTE — Telephone Encounter (Signed)
Attempted to contact patient to get her rescheduled for her missed new OB appointment. No answer and unable to leave a message due to the voicemail not being set-up. No show letter mailed

## 2019-02-19 NOTE — Progress Notes (Signed)
Call got a message stating Voice mail not set up 844am.  Sent Text invite at 845am  850am ended video call patient did not answer.

## 2019-02-28 ENCOUNTER — Ambulatory Visit (INDEPENDENT_AMBULATORY_CARE_PROVIDER_SITE_OTHER): Payer: Medicaid Other | Admitting: Obstetrics and Gynecology

## 2019-02-28 ENCOUNTER — Other Ambulatory Visit: Payer: Self-pay

## 2019-02-28 ENCOUNTER — Other Ambulatory Visit (HOSPITAL_COMMUNITY)
Admission: RE | Admit: 2019-02-28 | Discharge: 2019-02-28 | Disposition: A | Discharge status: Home / Self Care | Payer: Medicaid Other | Source: Ambulatory Visit | Attending: Obstetrics and Gynecology | Admitting: Obstetrics and Gynecology

## 2019-02-28 VITALS — BP 111/74 | HR 92 | Wt 166.0 lb

## 2019-02-28 DIAGNOSIS — Z23 Encounter for immunization: Secondary | ICD-10-CM | POA: Diagnosis not present

## 2019-02-28 DIAGNOSIS — Z3A14 14 weeks gestation of pregnancy: Secondary | ICD-10-CM | POA: Diagnosis not present

## 2019-02-28 DIAGNOSIS — Z3492 Encounter for supervision of normal pregnancy, unspecified, second trimester: Secondary | ICD-10-CM

## 2019-02-28 DIAGNOSIS — Z3490 Encounter for supervision of normal pregnancy, unspecified, unspecified trimester: Secondary | ICD-10-CM | POA: Diagnosis not present

## 2019-02-28 NOTE — Progress Notes (Signed)
History:   Erica Harrell is a 24 y.o. [phone] at 61w1dby LMP being seen today for her first obstetrical visit.  Her obstetrical history is significant for C/S X 2. Patient does not intend to breast feed. Pregnancy history fully reviewed.  Patient reports no complaints.      HISTORY: OB History  Gravida Para Term Preterm AB Living  _0 0 1 2  SAB TAB Ectopic Multiple Live Births  1 0 0 0 2    # Outcome Date GA Lbr Len/2nd Weight Sex Delivery Anes PTL Lv  4 Current           3 Term 05/27/15 450w1d9 lb 11.2 oz (4.4 kg) F CS-Vac Spinal  LIV     Name: Hoare,GIRL Lashaunda     Apgar1: 9  Apgar5: 9  2 SAB 2015          1 Term 03/08/11 403w0d lb 7 oz (3.827 kg) F CS-LTranv   LIV    Obstetric Comments  Elective C/S    Last pap smear was done 2018 and was normal  Past Medical History:  Diagnosis Date  . Asthma   . Bacterial vaginosis   . Constipation   . Depression   . Headache(784.0)   . Pelvic inflammatory disease   . Seasonal allergies    Past Surgical History:  Procedure Laterality Date  . CESAREAN SECTION    . CESAREAN SECTION N/A 05/27/2015   Procedure: CESAREAN SECTION;  Surgeon: JamWoodroe ModeD;  Location: [facility];  Service: Obstetrics;  Laterality: N/A;  . VAGINAL DELIVERY     Family History  Problem Relation Age of Onset  . Asthma Mother   . Diabetes Maternal Aunt   . Diabetes Maternal Uncle   . Hypertension Maternal Grandmother   . Diabetes Maternal Grandmother   . Asthma Maternal Grandmother    Social History   Tobacco Use  . Smoking status: Former Smoker    Types: Cigarettes    Quit date: 12/21/2018    Years since quitting: 0.1  . Smokeless tobacco: Never Used  Substance Use Topics  . Alcohol use: No  . Drug use: No   Allergies  Allergen Reactions  . Shellfish Allergy Anaphylaxis  . Zoloft [Sertraline Hcl] Anaphylaxis   Current Outpatient Medications on File Prior to Visit  Medication Sig Dispense Refill  . doxycycline  (VIBRAMYCIN) 100 MG capsule Take 1 capsule (100 mg total) by mouth 2 (two) times daily. 20 capsule 0  . metoCLOPramide (REGLAN) 10 MG tablet Take 1 tablet (10 mg total) by mouth 4 (four) times daily -  before meals and at bedtime. 90 tablet 1  . Prenatal Vit-Fe Fumarate-FA (PREPLUS) 27-1 MG TABS Take 1 tablet by mouth daily. 30 tablet 6  . Blood Pressure Monitoring (BLOOD PRESSURE KIT) DEVI 1 Device by Does not apply route as needed. (Patient not taking: Reported on 02/05/2019) 1 each 0   No current facility-administered medications on file prior to visit.    Review of Systems Pertinent items noted in HPI and remainder of comprehensive ROS otherwise negative. Physical Exam:   Vitals:   02/28/19 1503  BP: 111/74  Pulse: 92  Weight: 166 lb (75.3 kg)   BP 111/74   Pulse 92   Wt 166 lb (75.3 kg)   LMP 11/21/2018 (Exact Date)   BMI 29.41 kg/m  Uterine Size: size equals dates  Pelvic Exam:    Perineum: No Hemorrhoids, Normal Perineum  Vulva: normal   Vagina:  normal mucosa, normal discharge, no palpable nodules   pH: Not done   Cervix: no bleeding following Pap, no cervical motion tenderness and no lesions   Adnexa: normal adnexa and no mass, fullness, tenderness   Bony Pelvis: Adequate  System: Breast:  No nipple retraction or dimpling, No nipple discharge or bleeding, No axillary or supraclavicular adenopathy, Normal to palpation without dominant masses   Skin: normal coloration and turgor, no rashes    Neurologic: negative   Extremities: normal strength, tone, and muscle mass   HEENT neck supple with midline trachea and thyroid without masses   Mouth/Teeth mucous membranes moist, pharynx normal without lesions   Neck supple and no masses   Cardiovascular: regular rate and rhythm, no murmurs or gallops   Respiratory:  appears well, vitals normal, no respiratory distress, acyanotic, normal RR, neck free of mass or lymphadenopathy, chest clear, no wheezing, crepitations, rhonchi,  normal symmetric air entry   Abdomen: soft, non-tender; bowel sounds normal; no masses,  no organomegaly   Urinary: urethral meatus normal   Assessment:    Pregnancy: [id number] Patient Active Problem List   Diagnosis Date Noted  . Supervision of low-risk pregnancy 01/21/2019  . History of 2 cesarean sections 01/21/2019  . Post term pregnancy, 41 weeks 05/26/2015    Plan:   1. Encounter for supervision of low-risk pregnancy, antepartum  - Genetic Screening - Obstetric Panel, Including HIV - Hemoglobin A1c - Culture, OB Urine - Cytology - PAP( [place])   Initial labs drawn. Continue prenatal vitamins. Genetic Screening discussed, NIPS: requested. Ultrasound discussed; fetal anatomic survey: ordered. Problem list reviewed and updated. The nature of [facility] with multiple MDs and other Advanced Practice Providers was explained to patient; also emphasized that residents, students are part of our team. Routine obstetric precautions reviewed.    Rasch, Artist Pais, Kenneth for [employer], [place]

## 2019-02-28 NOTE — Patient Instructions (Signed)

## 2019-03-01 LAB — OBSTETRIC PANEL, INCLUDING HIV
Antibody Screen: NEGATIVE
Basophils Absolute: 0 10*3/uL (ref 0.0–0.2)
Basos: 0 %
EOS (ABSOLUTE): 0.2 10*3/uL (ref 0.0–0.4)
Eos: 2 %
HIV Screen 4th Generation wRfx: NONREACTIVE
Hematocrit: 36.1 % (ref 34.0–46.6)
Hemoglobin: 12.4 g/dL (ref 11.1–15.9)
Hepatitis B Surface Ag: NEGATIVE
Immature Grans (Abs): 0 10*3/uL (ref 0.0–0.1)
Immature Granulocytes: 1 %
Lymphocytes Absolute: 1.6 10*3/uL (ref 0.7–3.1)
Lymphs: 21 %
MCH: 31.6 pg (ref 26.6–33.0)
MCHC: 34.3 g/dL (ref 31.5–35.7)
MCV: 92 fL (ref 79–97)
Monocytes Absolute: 0.7 10*3/uL (ref 0.1–0.9)
Monocytes: 9 %
Neutrophils Absolute: 5.2 10*3/uL (ref 1.4–7.0)
Neutrophils: 67 %
Platelets: 237 10*3/uL (ref 150–450)
RBC: 3.92 x10E6/uL (ref 3.77–5.28)
RDW: 12.1 % (ref 11.7–15.4)
RPR Ser Ql: NONREACTIVE
Rh Factor: POSITIVE
Rubella Antibodies, IGG: 16.2 index (ref >0.99)
WBC: 7.8 10*3/uL (ref 3.4–10.8)

## 2019-03-01 LAB — HEMOGLOBIN A1C
Est. average glucose Bld gHb Est-mCnc: 97 mg/dL
Hgb A1c MFr Bld: 5 % (ref 4.8–5.6)

## 2019-03-03 LAB — URINE CULTURE, OB REFLEX

## 2019-03-03 LAB — CULTURE, OB URINE

## 2019-03-04 ENCOUNTER — Telehealth: Payer: Self-pay | Admitting: Family Medicine

## 2019-03-04 ENCOUNTER — Other Ambulatory Visit: Payer: Self-pay | Admitting: Obstetrics and Gynecology

## 2019-03-04 LAB — CYTOLOGY - PAP
Chlamydia: NEGATIVE
Comment: NEGATIVE
Comment: NORMAL
Diagnosis: NEGATIVE
Neisseria Gonorrhea: NEGATIVE

## 2019-03-04 MED ORDER — NITROFURANTOIN MONOHYD MACRO 100 MG PO CAPS: 100.0000 mg | ORAL_CAPSULE | Freq: Two times a day (BID) | ORAL | 0 refills | Status: AC

## 2019-03-04 NOTE — Telephone Encounter (Signed)
Patient called in stating she needs to speak to a nurse to call to them about her test results. Patient instructed that a message will be sent to the nurses and they will reach out to her as soon as they can. Patient verbalized understanding. Message sent to clinical pool.

## 2019-03-04 NOTE — Telephone Encounter (Signed)
Attempted to contact pt unable to LM due to VM box not set up.    Addison Naegeli, RN 03/04/19

## 2019-03-04 NOTE — Progress Notes (Signed)
Patient called and notified of Urine culture results.  +  Staphylococcus saprophyticus  Rx: Macrobid sent. Patient aware.    Duane Lope, NP 03/04/2019 3:38 PM

## 2019-03-11 ENCOUNTER — Encounter: Payer: Self-pay | Admitting: *Deleted

## 2019-03-28 ENCOUNTER — Telehealth (INDEPENDENT_AMBULATORY_CARE_PROVIDER_SITE_OTHER): Payer: Medicaid Other | Admitting: Obstetrics and Gynecology

## 2019-03-28 DIAGNOSIS — Z3A18 18 weeks gestation of pregnancy: Secondary | ICD-10-CM

## 2019-03-28 DIAGNOSIS — Z3492 Encounter for supervision of normal pregnancy, unspecified, second trimester: Secondary | ICD-10-CM

## 2019-03-28 DIAGNOSIS — Z98891 History of uterine scar from previous surgery: Secondary | ICD-10-CM

## 2019-03-28 DIAGNOSIS — Z3490 Encounter for supervision of normal pregnancy, unspecified, unspecified trimester: Secondary | ICD-10-CM

## 2019-03-28 NOTE — Progress Notes (Signed)
   TELEHEALTH VIRTUAL OBSTETRICS VISIT ENCOUNTER NOTE  I connected with Erica Harrell on 03/28/19 at  2:35 PM EST by telephone at home and verified that I am speaking with the correct person using two identifiers.   I discussed the limitations, risks, security and privacy concerns of performing an evaluation and management service by telephone and the availability of in person appointments. I also discussed with the patient that there may be a patient responsible charge related to this service. The patient expressed understanding and agreed to proceed.  Subjective:  Erica Harrell is a 24 y.o. [phone] at [redacted]w[redacted]d being followed for ongoing prenatal care.  She is currently monitored for the following issues for this low-risk pregnancy and has Post term pregnancy, 41 weeks; Supervision of low-risk pregnancy; and History of 2 cesarean sections on their problem list.  Patient reports no complaints. Reports fetal movement. Denies any contractions, bleeding or leaking of fluid.   The following portions of the patient's history were reviewed and updated as appropriate: allergies, current medications, past family history, past medical history, past social history, past surgical history and problem list.   Objective:   General:  Alert, oriented and cooperative.   Mental Status: Normal mood and affect perceived. Normal judgment and thought content.  Rest of physical exam deferred due to type of encounter  Assessment and Plan:  Pregnancy: [id number] at [redacted]w[redacted]d 1. Encounter for supervision of low-risk pregnancy, antepartum  Bp good.  She is dong good   2. History of 2 cesarean sections  Really would like a vaginal delivery. Explained that she would need to meet with one of our MD's to discuss. This may not be an option and she is aware of this.   Preterm labor symptoms and general obstetric precautions including but not limited to vaginal bleeding, contractions, leaking of fluid and fetal movement  were reviewed in detail with the patient.  I discussed the assessment and treatment plan with the patient. The patient was provided an opportunity to ask questions and all were answered. The patient agreed with the plan and demonstrated an understanding of the instructions. The patient was advised to call back or seek an in-person office evaluation/go to MAU at [facility] for any urgent or concerning symptoms. Please refer to After Visit Summary for other counseling recommendations.   I provided 9 minutes of non-face-to-face time during this encounter.  Return in about 4 weeks (around 04/25/2019) for Please schedule with MD in 4 weeks to discuss TOLAC after C-section x 2, virtual ok .  Future Appointments  Date Time Provider Department Center  04/08/2019  2:45 PM [facility] [place] 2 [facility]    Venia Carbon, NP Center for [employer], [facility]

## 2019-03-28 NOTE — Progress Notes (Signed)
I connected with  Ala Dach on 03/28/19 at  2:35 PM EST by telephone and verified that I am speaking with the correct person using two identifiers.   I discussed the limitations, risks, security and privacy concerns of performing an evaluation and management service by telephone and the availability of in person appointments. I also discussed with the patient that there may be a patient responsible charge related to this service. The patient expressed understanding and agreed to proceed.  Ernestina Patches, CMA 03/28/2019  2:36 PM

## 2019-04-08 ENCOUNTER — Ambulatory Visit (HOSPITAL_COMMUNITY)
Admission: RE | Admit: 2019-04-08 | Discharge: 2019-04-08 | Disposition: A | Discharge status: Home / Self Care | Payer: Medicaid Other | Source: Ambulatory Visit | Attending: Obstetrics and Gynecology | Admitting: Obstetrics and Gynecology

## 2019-04-08 ENCOUNTER — Other Ambulatory Visit: Payer: Self-pay

## 2019-04-08 ENCOUNTER — Other Ambulatory Visit (HOSPITAL_COMMUNITY): Payer: Self-pay | Admitting: *Deleted

## 2019-04-08 DIAGNOSIS — Z3A19 19 weeks gestation of pregnancy: Secondary | ICD-10-CM | POA: Diagnosis not present

## 2019-04-08 DIAGNOSIS — Z363 Encounter for antenatal screening for malformations: Secondary | ICD-10-CM | POA: Diagnosis not present

## 2019-04-08 DIAGNOSIS — Z3490 Encounter for supervision of normal pregnancy, unspecified, unspecified trimester: Secondary | ICD-10-CM | POA: Insufficient documentation

## 2019-04-08 DIAGNOSIS — O34219 Maternal care for unspecified type scar from previous cesarean delivery: Secondary | ICD-10-CM | POA: Diagnosis not present

## 2019-04-08 DIAGNOSIS — Z362 Encounter for other antenatal screening follow-up: Secondary | ICD-10-CM

## 2019-04-26 ENCOUNTER — Encounter: Payer: Medicaid Other | Admitting: Obstetrics & Gynecology

## 2019-04-26 DIAGNOSIS — Z3490 Encounter for supervision of normal pregnancy, unspecified, unspecified trimester: Secondary | ICD-10-CM

## 2019-04-26 DIAGNOSIS — Z98891 History of uterine scar from previous surgery: Secondary | ICD-10-CM

## 2019-04-26 NOTE — Progress Notes (Signed)
Called pt @ [phone] and received message that the person that you are trying to reach does not have a mailbox not set up yet.     I connected with  Ala Dach on 04/26/19 at 0830 by telephone and verified that I am speaking with the correct person using two identifiers.   I discussed the limitations, risks, security and privacy concerns of performing an evaluation and management service by telephone and the availability of in person appointments. I also discussed with the patient that there may be a patient responsible charge related to this service. The patient expressed understanding and agreed to proceed.  Ralene Bathe, RN 04/26/2019  8:32 AM

## 2019-04-26 NOTE — Progress Notes (Signed)
Unable to complete the visit, patient did not connect

## 2019-05-06 ENCOUNTER — Other Ambulatory Visit: Payer: Self-pay

## 2019-05-06 ENCOUNTER — Ambulatory Visit (HOSPITAL_COMMUNITY)
Admission: RE | Admit: 2019-05-06 | Discharge: 2019-05-06 | Disposition: A | Discharge status: Home / Self Care | Payer: Medicaid Other | Source: Ambulatory Visit | Attending: Obstetrics and Gynecology | Admitting: Obstetrics and Gynecology

## 2019-05-06 DIAGNOSIS — Z362 Encounter for other antenatal screening follow-up: Secondary | ICD-10-CM | POA: Diagnosis present

## 2019-05-06 DIAGNOSIS — Z3A23 23 weeks gestation of pregnancy: Secondary | ICD-10-CM

## 2019-05-06 DIAGNOSIS — O34219 Maternal care for unspecified type scar from previous cesarean delivery: Secondary | ICD-10-CM | POA: Diagnosis not present

## 2019-05-16 ENCOUNTER — Other Ambulatory Visit: Payer: Self-pay

## 2019-05-16 ENCOUNTER — Encounter: Payer: Self-pay | Admitting: Obstetrics and Gynecology

## 2019-05-16 ENCOUNTER — Ambulatory Visit (INDEPENDENT_AMBULATORY_CARE_PROVIDER_SITE_OTHER): Payer: Medicaid Other | Admitting: Obstetrics and Gynecology

## 2019-05-16 VITALS — BP 114/59 | HR 78 | Wt 180.3 lb

## 2019-05-16 DIAGNOSIS — Z3492 Encounter for supervision of normal pregnancy, unspecified, second trimester: Secondary | ICD-10-CM

## 2019-05-16 DIAGNOSIS — Z98891 History of uterine scar from previous surgery: Secondary | ICD-10-CM

## 2019-05-16 NOTE — Progress Notes (Signed)
Subjective:  Erica Harrell is a 24 y.o. [phone] at [redacted]w[redacted]d being seen today for ongoing prenatal care.  She is currently monitored for the following issues for this low-risk pregnancy and has Supervision of low-risk pregnancy and History of 2 cesarean sections on their problem list.  Patient reports no complaints.  Contractions: Not present. Vag. Bleeding: None.  Movement: Present. Denies leaking of fluid.   The following portions of the patient's history were reviewed and updated as appropriate: allergies, current medications, past family history, past medical history, past social history, past surgical history and problem list. Problem list updated.  Objective:   Vitals:   05/16/19 1508  BP: (!) 114/59  Pulse: 78  Weight: 180 lb 4.8 oz (81.8 kg)    Fetal Status: Fetal Heart Rate (bpm): 150   Movement: Present     General:  Alert, oriented and cooperative. Patient is in no acute distress.  Skin: Skin is warm and dry. No rash noted.   Cardiovascular: Normal heart rate noted  Respiratory: Normal respiratory effort, no problems with respiration noted  Abdomen: Soft, gravid, appropriate for gestational age. Pain/Pressure: Absent     Pelvic:  Cervical exam deferred        Extremities: Normal range of motion.  Edema: None  Mental Status: Normal mood and affect. Normal behavior. Normal judgment and thought content.   Urinalysis:      Assessment and Plan:  Pregnancy: [id number] at [redacted]w[redacted]d  1. Encounter for supervision of low-risk pregnancy in second trimester Stable Glucola next visit  2. History of 2 cesarean sections Desires repeat, consent obtained  Preterm labor symptoms and general obstetric precautions including but not limited to vaginal bleeding, contractions, leaking of fluid and fetal movement were reviewed in detail with the patient. Please refer to After Visit Summary for other counseling recommendations.  Return in about 3 weeks (around 06/06/2019) for OB visit, face to  face, glucola, any provider.   Hermina Staggers, MD

## 2019-05-16 NOTE — Patient Instructions (Signed)

## 2019-05-28 ENCOUNTER — Ambulatory Visit (INDEPENDENT_AMBULATORY_CARE_PROVIDER_SITE_OTHER): Payer: Medicaid Other

## 2019-05-28 ENCOUNTER — Other Ambulatory Visit: Payer: Self-pay

## 2019-05-28 VITALS — BP 122/73 | HR 88

## 2019-05-28 DIAGNOSIS — N390 Urinary tract infection, site not specified: Secondary | ICD-10-CM

## 2019-05-28 DIAGNOSIS — R3989 Other symptoms and signs involving the genitourinary system: Secondary | ICD-10-CM

## 2019-05-28 LAB — POCT URINALYSIS DIP (DEVICE)
Bilirubin Urine: NEGATIVE
Glucose, UA: NEGATIVE mg/dL
Hgb urine dipstick: NEGATIVE
Ketones, ur: NEGATIVE mg/dL
Leukocytes,Ua: NEGATIVE
Nitrite: NEGATIVE
Protein, ur: NEGATIVE mg/dL
Specific Gravity, Urine: <=1.005 (ref 1.005–1.030)
Urobilinogen, UA: 0.2 mg/dL (ref 0.0–1.0)
pH: 6 (ref 5.0–8.0)

## 2019-05-28 NOTE — Progress Notes (Signed)
Pt here today with c/o change of color in urine.  Pt denies any other UTI sx's.  UA resulted negative.  Pt informed that her urine can change color based upon what she intakes food or drink wise.  Pt verbalized understanding with no further questions.   Addison Naegeli, RN 05/28/18

## 2019-05-28 NOTE — Progress Notes (Signed)
Patient seen and assessed by nursing staff during this encounter. I have reviewed the chart and agree with the documentation and plan. I have also made any necessary editorial changes.  Modestine Scherzinger DNP, CNM  05/28/19  5:12 PM    

## 2019-06-05 ENCOUNTER — Other Ambulatory Visit: Payer: Self-pay | Admitting: General Practice

## 2019-06-05 ENCOUNTER — Other Ambulatory Visit: Payer: Medicaid Other

## 2019-06-05 ENCOUNTER — Encounter: Payer: Self-pay | Admitting: Family Medicine

## 2019-06-05 ENCOUNTER — Encounter: Payer: Medicaid Other | Admitting: Family Medicine

## 2019-06-05 DIAGNOSIS — Z3493 Encounter for supervision of normal pregnancy, unspecified, third trimester: Secondary | ICD-10-CM

## 2019-06-05 NOTE — Progress Notes (Signed)
Patient did not keep appointment today. She will be called to reschedule.  

## 2019-06-12 ENCOUNTER — Other Ambulatory Visit: Payer: Medicaid Other

## 2019-06-12 ENCOUNTER — Ambulatory Visit (INDEPENDENT_AMBULATORY_CARE_PROVIDER_SITE_OTHER): Payer: Medicaid Other | Admitting: Obstetrics & Gynecology

## 2019-06-12 ENCOUNTER — Other Ambulatory Visit: Payer: Self-pay

## 2019-06-12 VITALS — BP 116/69 | HR 90 | Wt 184.0 lb

## 2019-06-12 DIAGNOSIS — K219 Gastro-esophageal reflux disease without esophagitis: Secondary | ICD-10-CM | POA: Diagnosis not present

## 2019-06-12 DIAGNOSIS — Z23 Encounter for immunization: Secondary | ICD-10-CM

## 2019-06-12 DIAGNOSIS — Z98891 History of uterine scar from previous surgery: Secondary | ICD-10-CM

## 2019-06-12 DIAGNOSIS — Z3493 Encounter for supervision of normal pregnancy, unspecified, third trimester: Secondary | ICD-10-CM

## 2019-06-12 DIAGNOSIS — O99613 Diseases of the digestive system complicating pregnancy, third trimester: Secondary | ICD-10-CM | POA: Diagnosis not present

## 2019-06-12 DIAGNOSIS — Z3A29 29 weeks gestation of pregnancy: Secondary | ICD-10-CM

## 2019-06-12 MED ORDER — FAMOTIDINE 20 MG PO TABS: 20.0000 mg | ORAL_TABLET | Freq: Two times a day (BID) | ORAL | 3 refills | Status: DC

## 2019-06-12 NOTE — Patient Instructions (Signed)

## 2019-06-12 NOTE — Progress Notes (Signed)
   PRENATAL VISIT NOTE  Subjective:  Erica Harrell is a 24 y.o. [phone] at [redacted]w[redacted]d being seen today for ongoing prenatal care.  She is currently monitored for the following issues for this low-risk pregnancy and has Supervision of low-risk pregnancy and History of 2 cesarean sections on their problem list.  Patient reports heartburn.  Contractions: Not present. Vag. Bleeding: None.  Movement: Present. Denies leaking of fluid.   The following portions of the patient's history were reviewed and updated as appropriate: allergies, current medications, past family history, past medical history, past social history, past surgical history and problem list.   Objective:   Vitals:   06/12/19 1026  BP: 116/69  Pulse: 90  Weight: 184 lb (83.5 kg)    Fetal Status: Fetal Heart Rate (bpm): 147 Fundal Height: 29 cm Movement: Present     General:  Alert, oriented and cooperative. Patient is in no acute distress.  Skin: Skin is warm and dry. No rash noted.   Cardiovascular: Normal heart rate noted  Respiratory: Normal respiratory effort, no problems with respiration noted  Abdomen: Soft, gravid, appropriate for gestational age.  Pain/Pressure: Absent     Pelvic: Cervical exam deferred        Extremities: Normal range of motion.  Edema: None  Mental Status: Normal mood and affect. Normal behavior. Normal judgment and thought content.   Assessment and Plan:  Pregnancy: [id number] at [redacted]w[redacted]d 1. Gastroesophageal reflux during pregnancy in third trimester, antepartum Pepcid prescribed.  If this doe snot work, consider Protonix/Nexium. - famotidine (PEPCID) 20 MG tablet; Take 1 tablet (20 mg total) by mouth 2 (two) times daily.  Dispense: 60 tablet; Refill: 3  2. History of 2 cesarean sections Surgical order sent to schedule RCS. Also considering BTS, Medicaid papers signed today.  Also counseled about LARCs, told her to consider IPP IUD placement or Nexplanon because she seems unsure about the BTS.  Emphasized permanence and irreversibility of BTS.  3. Encounter for supervision of low-risk pregnancy in third trimester Third trimester labs done today, will follow up results and manage accordingly. Tdap vaccine also given.  Preterm labor symptoms and general obstetric precautions including but not limited to vaginal bleeding, contractions, leaking of fluid and fetal movement were reviewed in detail with the patient. Please refer to After Visit Summary for other counseling recommendations.   Return in about 2 weeks (around 06/26/2019) for OFFICE LOB Visit (has too many issues with virtual)..  No future appointments.  Jaynie Collins, MD

## 2019-06-13 ENCOUNTER — Encounter: Payer: Self-pay | Admitting: *Deleted

## 2019-06-13 LAB — CBC
Hematocrit: 32.8 % — ABNORMAL LOW (ref 34.0–46.6)
Hemoglobin: 11.1 g/dL (ref 11.1–15.9)
MCH: 30.7 pg (ref 26.6–33.0)
MCHC: 33.8 g/dL (ref 31.5–35.7)
MCV: 91 fL (ref 79–97)
Platelets: 169 10*3/uL (ref 150–450)
RBC: 3.62 x10E6/uL — ABNORMAL LOW (ref 3.77–5.28)
RDW: 12.5 % (ref 11.7–15.4)
WBC: 8.9 10*3/uL (ref 3.4–10.8)

## 2019-06-13 LAB — GLUCOSE TOLERANCE, 2 HOURS W/ 1HR
Glucose, 1 hour: 162 mg/dL (ref 65–179)
Glucose, 2 hour: 114 mg/dL (ref 65–152)
Glucose, Fasting: 90 mg/dL (ref 65–91)

## 2019-06-13 LAB — RPR: RPR Ser Ql: NONREACTIVE

## 2019-06-13 LAB — HIV ANTIBODY (ROUTINE TESTING W REFLEX): HIV Screen 4th Generation wRfx: NONREACTIVE

## 2019-06-27 ENCOUNTER — Telehealth (INDEPENDENT_AMBULATORY_CARE_PROVIDER_SITE_OTHER): Payer: Medicaid Other | Admitting: Obstetrics and Gynecology

## 2019-06-27 DIAGNOSIS — Z3483 Encounter for supervision of other normal pregnancy, third trimester: Secondary | ICD-10-CM

## 2019-06-27 DIAGNOSIS — Z3A31 31 weeks gestation of pregnancy: Secondary | ICD-10-CM

## 2019-06-27 DIAGNOSIS — Z3493 Encounter for supervision of normal pregnancy, unspecified, third trimester: Secondary | ICD-10-CM

## 2019-06-27 NOTE — Progress Notes (Signed)
   TELEHEALTH VIRTUAL OBSTETRICS VISIT ENCOUNTER NOTE  I connected with Erica Harrell on 06/27/19 at  1:15 PM EDT by telephone at home and verified that I am speaking with the correct person using two identifiers.   I discussed the limitations, risks, security and privacy concerns of performing an evaluation and management service by telephone and the availability of in person appointments. I also discussed with the patient that there may be a patient responsible charge related to this service. The patient expressed understanding and agreed to proceed.  Subjective:  Erica Harrell is a 24 y.o. [phone] at [redacted]w[redacted]d being followed for ongoing prenatal care.  She is currently monitored for the following issues for this low-risk pregnancy and has Supervision of low-risk pregnancy and History of 2 cesarean sections on their problem list.  Patient reports no complaints. Reports fetal movement. Denies any contractions, bleeding or leaking of fluid.   The following portions of the patient's history were reviewed and updated as appropriate: allergies, current medications, past family history, past medical history, past social history, past surgical history and problem list.   Objective:   General:  Alert, oriented and cooperative.   Mental Status: Normal mood and affect perceived. Normal judgment and thought content.  Rest of physical exam deferred due to type of encounter  Assessment and Plan:  Pregnancy: [id number] at [redacted]w[redacted]d 1. Encounter for supervision of low-risk pregnancy in third trimester  Checked Bp at home and states it was normal. She is picking up her children now from school and does not have the BP number in front of her Doing well Does not want tubal.  Dr. Mervyn Skeeters sent message last visit to have CS scheduled Discussed medicaid coverage for circumcision.   Preterm labor symptoms and general obstetric precautions including but not limited to vaginal bleeding, contractions, leaking of fluid  and fetal movement were reviewed in detail with the patient.  I discussed the assessment and treatment plan with the patient. The patient was provided an opportunity to ask questions and all were answered. The patient agreed with the plan and demonstrated an understanding of the instructions. The patient was advised to call back or seek an in-person office evaluation/go to [facility]U at [facility] for any urgent or concerning symptoms. Please refer to After Visit Summary for other counseling recommendations.   I provided 10 minutes of non-face-to-face time during this encounter.  No follow-ups on file.  Future Appointments  Date Time Provider Department Center  07/11/2019  2:15 PM Kayleanna Lorman, Harolyn Rutherford, NP [facility]  07/25/2019  1:15 PM Emmarie Sannes, Harolyn Rutherford, NP [facility]    Venia Carbon, NP Center for [employer], [facility]

## 2019-06-27 NOTE — Progress Notes (Signed)
I connected with  Erica Harrell on 06/27/19 at  1:15 PM EDT by telephone and verified that I am speaking with the correct person using two identifiers.   I discussed the limitations, risks, security and privacy concerns of performing an evaluation and management service by telephone and the availability of in person appointments. I also discussed with the patient that there may be a patient responsible charge related to this service. The patient expressed understanding and agreed to proceed.  Erica Harrell, CMA 06/27/2019  1:29 PM   Bp was normal took it earlier was

## 2019-07-11 ENCOUNTER — Encounter: Payer: Medicaid Other | Admitting: Obstetrics and Gynecology

## 2019-07-12 ENCOUNTER — Encounter: Payer: Medicaid Other | Admitting: Medical

## 2019-07-16 ENCOUNTER — Telehealth (INDEPENDENT_AMBULATORY_CARE_PROVIDER_SITE_OTHER): Payer: Medicaid Other | Admitting: Lactation Services

## 2019-07-16 DIAGNOSIS — Z3493 Encounter for supervision of normal pregnancy, unspecified, third trimester: Secondary | ICD-10-CM

## 2019-07-16 NOTE — Telephone Encounter (Signed)
patient called into the office and reports her BP keeps dropping. She reports the last one was 69/44. They have ranged from 90 to 69 systolically. They were taken at different times. She is not aware of having previous low BP or los blood sugars.   Patient is taking her BP at home. She reports she feels dizzy and lightheaded and will then check her BP. She noted these symptoms last week. She denies being sick recently.   Patient reports the baby is moving well, she is having intermittent contractions.   She reports she cannot eat well recently, she has lost her appetite. She reports she is sleepy a lot. She is drinking and voiding a lot. She is taking her PNV.   She reports yesterday she ate neck bones and rice, potatoes, cereal in the morning. She is drinking lots of water.   Reviewed eating 3 small meals and 3 snacks during the day. Asked her to eat protein at every meal. Gave her ideas of protein to eat if possible.   She is feeling ok right now. She is driving. She was asked to go home and sit for about 15 minutes and take her BP again and send a My Chart message with the results. Patient voiced understanding.

## 2019-07-25 ENCOUNTER — Ambulatory Visit (INDEPENDENT_AMBULATORY_CARE_PROVIDER_SITE_OTHER): Payer: Medicaid Other | Admitting: Obstetrics and Gynecology

## 2019-07-25 ENCOUNTER — Other Ambulatory Visit: Payer: Self-pay

## 2019-07-25 VITALS — BP 110/68 | HR 88 | Wt 192.7 lb

## 2019-07-25 DIAGNOSIS — Z3493 Encounter for supervision of normal pregnancy, unspecified, third trimester: Secondary | ICD-10-CM

## 2019-07-25 DIAGNOSIS — Z98891 History of uterine scar from previous surgery: Secondary | ICD-10-CM

## 2019-07-25 DIAGNOSIS — Z3A35 35 weeks gestation of pregnancy: Secondary | ICD-10-CM

## 2019-07-25 DIAGNOSIS — O34219 Maternal care for unspecified type scar from previous cesarean delivery: Secondary | ICD-10-CM

## 2019-07-25 MED ORDER — PANTOPRAZOLE SODIUM 40 MG PO TBEC: 40.0000 mg | DELAYED_RELEASE_TABLET | Freq: Every day | ORAL | 1 refills | Status: DC

## 2019-07-25 NOTE — Patient Instructions (Signed)
[email or web address] 

## 2019-07-25 NOTE — Progress Notes (Signed)
   PRENATAL VISIT NOTE  Subjective:  Erica Harrell is a 24 y.o. [phone] at [redacted]w[redacted]d being seen today for ongoing prenatal care.  She is currently monitored for the following issues for this low-risk pregnancy and has Supervision of low-risk pregnancy and History of 2 cesarean sections on their problem list.  Patient reports GERD, elevated BP readings at home with her cuff. .  Contractions: Not present. Vag. Bleeding: None.  Movement: Present. Denies leaking of fluid.  Reflux - taking pepcid as needed, states it is not working for her.   The following portions of the patient's history were reviewed and updated as appropriate: allergies, current medications, past family history, past medical history, past social history, past surgical history and problem list.   Objective:   Vitals:   07/25/19 1331  BP: 110/68  Pulse: 88  Weight: 192 lb 11.2 oz (87.4 kg)    Fetal Status: Fetal Heart Rate (bpm): 149 Fundal Height: 36 cm Movement: Present     General:  Alert, oriented and cooperative. Patient is in no acute distress.  Skin: Skin is warm and dry. No rash noted.   Cardiovascular: Normal heart rate noted  Respiratory: Normal respiratory effort, no problems with respiration noted  Abdomen: Soft, gravid, appropriate for gestational age.  Pain/Pressure: Present     Pelvic: Cervical exam deferred        Extremities: Normal range of motion.  Edema: Mild pitting, slight indentation  Mental Status: Normal mood and affect. Normal behavior. Normal judgment and thought content.   Assessment and Plan:  Pregnancy: [id number] at [redacted]w[redacted]d  1. Encounter for supervision of low-risk pregnancy in third trimester  Elevated BP readings at home, BP normal today Bring BP to office next week to compare readings GHTN precautions  GERD, stop pepcid, start protonix   2. History of 2 cesarean sections  Repeat scheduled 6/30 GBS next visit   Preterm labor symptoms and general obstetric precautions including  but not limited to vaginal bleeding, contractions, leaking of fluid and fetal movement were reviewed in detail with the patient. Please refer to After Visit Summary for other counseling recommendations.   No follow-ups on file.  Future Appointments  Date Time Provider Department Center  08/02/2019  8:10 AM Erica [place] Bing, MD [facility]  08/09/2019 10:35 AM Erica Harrell, Erica Bastos, MD [facility]  08/15/2019  9:15 AM Erica Harrell, Erica Harrell, Erica Harrell  08/22/2019 10:15 AM Erica Harrell, Erica Harrell    Erica Carbon, Erica

## 2019-08-02 ENCOUNTER — Encounter: Payer: Self-pay | Admitting: Obstetrics and Gynecology

## 2019-08-02 ENCOUNTER — Encounter: Payer: Medicaid Other | Admitting: Obstetrics and Gynecology

## 2019-08-05 NOTE — Progress Notes (Signed)
Patient did not keep her OB appointment for 08/02/2019.  Cornelia Copa MD Attending Center for [employer] Midwife)

## 2019-08-09 ENCOUNTER — Encounter: Payer: Self-pay | Admitting: Obstetrics and Gynecology

## 2019-08-09 ENCOUNTER — Ambulatory Visit (INDEPENDENT_AMBULATORY_CARE_PROVIDER_SITE_OTHER): Payer: Medicaid Other | Admitting: Obstetrics and Gynecology

## 2019-08-09 ENCOUNTER — Encounter (HOSPITAL_COMMUNITY): Payer: Self-pay

## 2019-08-09 ENCOUNTER — Other Ambulatory Visit: Payer: Self-pay

## 2019-08-09 ENCOUNTER — Other Ambulatory Visit (HOSPITAL_COMMUNITY)
Admission: RE | Admit: 2019-08-09 | Discharge: 2019-08-09 | Disposition: A | Discharge status: Home / Self Care | Payer: Medicaid Other | Source: Ambulatory Visit | Attending: Obstetrics & Gynecology | Admitting: Obstetrics & Gynecology

## 2019-08-09 VITALS — BP 112/66 | HR 97 | Wt 194.1 lb

## 2019-08-09 DIAGNOSIS — Z3A37 37 weeks gestation of pregnancy: Secondary | ICD-10-CM

## 2019-08-09 DIAGNOSIS — Z3493 Encounter for supervision of normal pregnancy, unspecified, third trimester: Secondary | ICD-10-CM | POA: Insufficient documentation

## 2019-08-09 DIAGNOSIS — J45909 Unspecified asthma, uncomplicated: Secondary | ICD-10-CM

## 2019-08-09 DIAGNOSIS — O99513 Diseases of the respiratory system complicating pregnancy, third trimester: Secondary | ICD-10-CM

## 2019-08-09 MED ORDER — ALBUTEROL SULFATE HFA 108 (90 BASE) MCG/ACT IN AERS: 2.0000 | INHALATION_SPRAY | Freq: Four times a day (QID) | RESPIRATORY_TRACT | 1 refills | Status: DC | PRN

## 2019-08-09 NOTE — Patient Instructions (Signed)

## 2019-08-09 NOTE — Patient Instructions (Signed)
Erica Harrell  08/09/2019   Your procedure is scheduled on:  08/21/2019  Arrive at 1030 at Entrance C on [address] at [facility]  and [address]. You are invited to use the FREE valet parking or use the Visitor's parking deck.  Pick up the phone at the desk and dial [phone].  Call this number if you have problems the morning of surgery: [phone]  Remember:   Do not eat food:(After Midnight) Desps de medianoche.  Do not drink clear liquids: (After Midnight) Desps de medianoche.  Take these medicines the morning of surgery with A SIP OF WATER:  Bring your inhaler with you.  Take protonix as directed by MD   Do not wear jewelry, make-up or nail polish.  Do not wear lotions, powders, or perfumes. Do not wear deodorant.  Do not shave 48 hours prior to surgery.  Do not bring valuables to the hospital.  [facility] is not   responsible for any belongings or valuables brought to the hospital.  Contacts, dentures or bridgework may not be worn into surgery.  Leave suitcase in the car. After surgery it may be brought to your room.  For patients admitted to the hospital, checkout time is 11:00 AM the day of              discharge.      Please read over the following fact sheets that you were given:     Preparing for Surgery

## 2019-08-09 NOTE — Progress Notes (Signed)
   PRENATAL VISIT NOTE  Subjective:  Erica Harrell is a 24 y.o. [phone] at [redacted]w[redacted]d being seen today for ongoing prenatal care.  She is currently monitored for the following issues for this low-risk pregnancy and has Supervision of low-risk pregnancy; History of 2 cesarean sections; and Asthma affecting pregnancy in third trimester on their problem list.  Patient doing well with no acute concerns today. She reports heartburn.  Contractions: Irritability. Vag. Bleeding: None.  Movement: Present. Denies leaking of fluid.   The following portions of the patient's history were reviewed and updated as appropriate: allergies, current medications, past family history, past medical history, past social history, past surgical history and problem list. Problem list updated.  Objective:   Vitals:   08/09/19 1033  BP: 112/66  Pulse: 97  Weight: 194 lb 1.6 oz (88 kg)    Fetal Status: Fetal Heart Rate (bpm): 154   Movement: Present     General:  Alert, oriented and cooperative. Patient is in no acute distress.  Skin: Skin is warm and dry. No rash noted.   Cardiovascular: Normal heart rate noted  Respiratory: Normal respiratory effort, no problems with respiration noted  Abdomen: Soft, gravid, appropriate for gestational age.  Pain/Pressure: Present     Pelvic: Cervical exam performed      cvx closed/40/-3  Extremities: Normal range of motion.  Edema: Trace  Mental Status:  Normal mood and affect. Normal behavior. Normal judgment and thought content.   Assessment and Plan:  Pregnancy: [id number] at [redacted]w[redacted]d  1. Encounter for supervision of low-risk pregnancy in third trimester  - Strep Gp B NAA - GC/Chlamydia probe amp ([place])not at [facility] Pt is getting repeat LTCS on 6/30, she currently declines tubal ligation and will get inpatient Nexplanon  2. Asthma affecting pregnancy in third trimester  - albuterol (VENTOLIN HFA) 108 (90 Base) MCG/ACT inhaler; Inhale 2 puffs into the lungs every 6  (six) hours as needed for wheezing or shortness of breath.  Dispense: 0.09 g; Refill: 1  Term labor symptoms and general obstetric precautions including but not limited to vaginal bleeding, contractions, leaking of fluid and fetal movement were reviewed in detail with the patient.  Please refer to After Visit Summary for other counseling recommendations.   Return in about 1 week (around 08/16/2019) for LOB.   Mariel Aloe, Md, [address]

## 2019-08-11 LAB — STREP GP B NAA: Strep Gp B NAA: POSITIVE — AB

## 2019-08-12 ENCOUNTER — Encounter: Payer: Self-pay | Admitting: Obstetrics and Gynecology

## 2019-08-12 DIAGNOSIS — O99820 Streptococcus B carrier state complicating pregnancy: Secondary | ICD-10-CM | POA: Insufficient documentation

## 2019-08-12 LAB — GC/CHLAMYDIA PROBE AMP (~~LOC~~) NOT AT ARMC
Chlamydia: NEGATIVE
Comment: NEGATIVE
Comment: NORMAL
Neisseria Gonorrhea: NEGATIVE

## 2019-08-15 ENCOUNTER — Ambulatory Visit (INDEPENDENT_AMBULATORY_CARE_PROVIDER_SITE_OTHER): Payer: Medicaid Other | Admitting: Obstetrics and Gynecology

## 2019-08-15 ENCOUNTER — Other Ambulatory Visit: Payer: Self-pay

## 2019-08-15 VITALS — BP 114/73 | HR 84 | Wt 193.4 lb

## 2019-08-15 DIAGNOSIS — Z98891 History of uterine scar from previous surgery: Secondary | ICD-10-CM

## 2019-08-15 DIAGNOSIS — Z3493 Encounter for supervision of normal pregnancy, unspecified, third trimester: Secondary | ICD-10-CM

## 2019-08-15 DIAGNOSIS — Z3A38 38 weeks gestation of pregnancy: Secondary | ICD-10-CM

## 2019-08-15 DIAGNOSIS — O99820 Streptococcus B carrier state complicating pregnancy: Secondary | ICD-10-CM

## 2019-08-15 NOTE — Progress Notes (Signed)
   PRENATAL VISIT NOTE  Subjective:  Erica Harrell is a 24 y.o. [phone] at [redacted]w[redacted]d being seen today for ongoing prenatal care.  She is currently monitored for the following issues for this low-risk pregnancy and has Supervision of low-risk pregnancy; History of 2 cesarean sections; Asthma affecting pregnancy in third trimester; and Group B streptococcal carriage complicating pregnancy on their problem list.  Patient reports no complaints.  Contractions: Not present. Vag. Bleeding: None.  Movement: Present. Denies leaking of fluid.   The following portions of the patient's history were reviewed and updated as appropriate: allergies, current medications, past family history, past medical history, past social history, past surgical history and problem list.   Objective:   Vitals:   08/15/19 0956  BP: 114/73  Pulse: 84  Weight: 193 lb 6.4 oz (87.7 kg)    Fetal Status: Fetal Heart Rate (bpm): 147 Fundal Height: 37 cm Movement: Present     General:  Alert, oriented and cooperative. Patient is in no acute distress.  Skin: Skin is warm and dry. No rash noted.   Cardiovascular: Normal heart rate noted  Respiratory: Normal respiratory effort, no problems with respiration noted  Abdomen: Soft, gravid, appropriate for gestational age.  Pain/Pressure: Absent     Pelvic: Cervical exam deferred        Extremities: Normal range of motion.  Edema: None  Mental Status: Normal mood and affect. Normal behavior. Normal judgment and thought content.   Assessment and Plan:  Pregnancy: [id number] at [redacted]w[redacted]d  1. Encounter for supervision of low-risk pregnancy in third trimester  Doing well BP good   2. History of 2 cesarean sections  Repeat scheduled for 6/30 Circ yes   3. Group B streptococcal carriage complicating pregnancy  Patient informed    Term labor symptoms and general obstetric precautions including but not limited to vaginal bleeding, contractions, leaking of fluid and fetal movement  were reviewed in detail with the patient. Please refer to After Visit Summary for other counseling recommendations.   Return in about 6 days (around 08/21/2019) for for repeat c-section.  Future Appointments  Date Time Provider Department Center  08/19/2019  8:30 AM [facility] 1 [facility] None  08/22/2019 10:15 AM Marny Lowenstein, PA-C [facility]    Venia Carbon, NP

## 2019-08-17 NOTE — OR Nursing (Signed)
Patient called into Pre-Op/PACU to request that we make a note on her chart that she DOES NOT want her tubes tied.  Avishai Reihl [place], [place] 08/17/2019 5:57 PM

## 2019-08-19 ENCOUNTER — Other Ambulatory Visit (HOSPITAL_COMMUNITY)
Admission: RE | Admit: 2019-08-19 | Discharge: 2019-08-19 | Disposition: A | Discharge status: Home / Self Care | Payer: Medicaid Other | Source: Ambulatory Visit | Attending: Obstetrics & Gynecology | Admitting: Obstetrics & Gynecology

## 2019-08-19 ENCOUNTER — Other Ambulatory Visit: Payer: Self-pay

## 2019-08-19 DIAGNOSIS — Z01812 Encounter for preprocedural laboratory examination: Secondary | ICD-10-CM | POA: Insufficient documentation

## 2019-08-19 DIAGNOSIS — Z20822 Contact with and (suspected) exposure to covid-19: Secondary | ICD-10-CM | POA: Diagnosis not present

## 2019-08-19 LAB — CBC
HCT: 36 % (ref 36.0–46.0)
Hemoglobin: 11.6 g/dL — ABNORMAL LOW (ref 12.0–15.0)
MCH: 29.2 pg (ref 26.0–34.0)
MCHC: 32.2 g/dL (ref 30.0–36.0)
MCV: 90.7 fL (ref 80.0–100.0)
Platelets: 212 10*3/uL (ref 150–400)
RBC: 3.97 MIL/uL (ref 3.87–5.11)
RDW: 11.9 % (ref 11.5–15.5)
WBC: 9.6 10*3/uL (ref 4.0–10.5)
nRBC: 0 % (ref 0.0–0.2)

## 2019-08-19 LAB — TYPE AND SCREEN
ABO/RH(D): AB POS
Antibody Screen: NEGATIVE

## 2019-08-19 LAB — ABO/RH: ABO/RH(D): AB POS

## 2019-08-19 LAB — SARS CORONAVIRUS 2 (TAT 6-24 HRS): SARS Coronavirus 2: NEGATIVE

## 2019-08-19 NOTE — MAU Note (Signed)
Asymptomatic, swab collected. Lab aware °

## 2019-08-20 ENCOUNTER — Encounter (HOSPITAL_COMMUNITY): Payer: Self-pay | Admitting: Obstetrics and Gynecology

## 2019-08-20 LAB — RPR: RPR Ser Ql: NONREACTIVE

## 2019-08-21 ENCOUNTER — Encounter (HOSPITAL_COMMUNITY)
Admission: RE | Disposition: A | Discharge status: Home / Self Care | Payer: Self-pay | Source: Home / Self Care | Attending: Obstetrics and Gynecology

## 2019-08-21 ENCOUNTER — Inpatient Hospital Stay (HOSPITAL_COMMUNITY): Payer: Medicaid Other | Admitting: Anesthesiology

## 2019-08-21 ENCOUNTER — Inpatient Hospital Stay (HOSPITAL_COMMUNITY)
Admission: RE | Admit: 2019-08-21 | Discharge: 2019-08-24 | DRG: 787 | Disposition: A | Discharge status: Home / Self Care | Payer: Medicaid Other | Attending: Obstetrics and Gynecology | Admitting: Obstetrics and Gynecology

## 2019-08-21 ENCOUNTER — Encounter (HOSPITAL_COMMUNITY): Payer: Self-pay | Admitting: Obstetrics and Gynecology

## 2019-08-21 ENCOUNTER — Other Ambulatory Visit: Payer: Self-pay

## 2019-08-21 DIAGNOSIS — Z98891 History of uterine scar from previous surgery: Secondary | ICD-10-CM

## 2019-08-21 DIAGNOSIS — D62 Acute posthemorrhagic anemia: Secondary | ICD-10-CM | POA: Diagnosis not present

## 2019-08-21 DIAGNOSIS — Z3A39 39 weeks gestation of pregnancy: Secondary | ICD-10-CM | POA: Diagnosis not present

## 2019-08-21 DIAGNOSIS — O34211 Maternal care for low transverse scar from previous cesarean delivery: Principal | ICD-10-CM | POA: Diagnosis present

## 2019-08-21 DIAGNOSIS — E669 Obesity, unspecified: Secondary | ICD-10-CM | POA: Diagnosis not present

## 2019-08-21 DIAGNOSIS — O34212 Maternal care for vertical scar from previous cesarean delivery: Secondary | ICD-10-CM | POA: Diagnosis not present

## 2019-08-21 DIAGNOSIS — O99214 Obesity complicating childbirth: Secondary | ICD-10-CM | POA: Diagnosis present

## 2019-08-21 DIAGNOSIS — O9952 Diseases of the respiratory system complicating childbirth: Secondary | ICD-10-CM | POA: Diagnosis present

## 2019-08-21 DIAGNOSIS — J45909 Unspecified asthma, uncomplicated: Secondary | ICD-10-CM | POA: Diagnosis not present

## 2019-08-21 DIAGNOSIS — O99824 Streptococcus B carrier state complicating childbirth: Secondary | ICD-10-CM | POA: Diagnosis not present

## 2019-08-21 DIAGNOSIS — Z30017 Encounter for initial prescription of implantable subdermal contraceptive: Secondary | ICD-10-CM | POA: Diagnosis not present

## 2019-08-21 DIAGNOSIS — Z87891 Personal history of nicotine dependence: Secondary | ICD-10-CM

## 2019-08-21 DIAGNOSIS — O9081 Anemia of the puerperium: Secondary | ICD-10-CM | POA: Diagnosis not present

## 2019-08-21 DIAGNOSIS — Z3490 Encounter for supervision of normal pregnancy, unspecified, unspecified trimester: Secondary | ICD-10-CM

## 2019-08-21 LAB — CBC
HCT: 36.3 % (ref 36.0–46.0)
Hemoglobin: 11.7 g/dL — ABNORMAL LOW (ref 12.0–15.0)
MCH: 29.2 pg (ref 26.0–34.0)
MCHC: 32.2 g/dL (ref 30.0–36.0)
MCV: 90.5 fL (ref 80.0–100.0)
Platelets: 222 10*3/uL (ref 150–400)
RBC: 4.01 MIL/uL (ref 3.87–5.11)
RDW: 12 % (ref 11.5–15.5)
WBC: 9.3 10*3/uL (ref 4.0–10.5)
nRBC: 0 % (ref 0.0–0.2)

## 2019-08-21 LAB — CREATININE, SERUM
Creatinine, Ser: 0.48 mg/dL (ref 0.44–1.00)
GFR calc Af Amer: >60 mL/min (ref >60)
GFR calc non Af Amer: >60 mL/min (ref >60)

## 2019-08-21 SURGERY — Surgical Case
Anesthesia: Spinal

## 2019-08-21 MED ORDER — DIPHENHYDRAMINE HCL 25 MG PO CAPS
25.0000 mg | ORAL_CAPSULE | ORAL | Status: DC | PRN
  Administered 2019-08-21: 25 mg via ORAL
  Filled 2019-08-21: qty 1

## 2019-08-21 MED ORDER — NALBUPHINE HCL 10 MG/ML IJ SOLN: 5.0000 mg | INTRAMUSCULAR | Status: DC | PRN

## 2019-08-21 MED ORDER — PHENYLEPHRINE HCL-NACL 20-0.9 MG/250ML-% IV SOLN
INTRAVENOUS | Status: AC
  Filled 2019-08-21: qty 250

## 2019-08-21 MED ORDER — PROMETHAZINE HCL 25 MG/ML IJ SOLN: 6.2500 mg | INTRAMUSCULAR | Status: DC | PRN

## 2019-08-21 MED ORDER — BUPIVACAINE IN DEXTROSE 0.75-8.25 % IT SOLN
INTRATHECAL | Status: DC | PRN
  Administered 2019-08-21: 1.6 mL via INTRATHECAL

## 2019-08-21 MED ORDER — ENOXAPARIN SODIUM 40 MG/0.4ML ~~LOC~~ SOLN
40.0000 mg | SUBCUTANEOUS | Status: DC
  Administered 2019-08-22 – 2019-08-24 (×3): 40 mg via SUBCUTANEOUS
  Filled 2019-08-21 (×3): qty 0.4

## 2019-08-21 MED ORDER — ORAL CARE MOUTH RINSE: 15.0000 mL | Freq: Once | OROMUCOSAL | Status: AC

## 2019-08-21 MED ORDER — MENTHOL 3 MG MT LOZG: 1.0000 | LOZENGE | OROMUCOSAL | Status: DC | PRN

## 2019-08-21 MED ORDER — SIMETHICONE 80 MG PO CHEW
80.0000 mg | CHEWABLE_TABLET | Freq: Three times a day (TID) | ORAL | Status: DC
  Administered 2019-08-21 – 2019-08-24 (×7): 80 mg via ORAL
  Filled 2019-08-21 (×7): qty 1

## 2019-08-21 MED ORDER — PRENATAL MULTIVITAMIN CH
1.0000 | ORAL_TABLET | Freq: Every day | ORAL | Status: DC
  Administered 2019-08-22 – 2019-08-24 (×3): 1 via ORAL
  Filled 2019-08-21 (×3): qty 1

## 2019-08-21 MED ORDER — SIMETHICONE 80 MG PO CHEW
80.0000 mg | CHEWABLE_TABLET | ORAL | Status: DC
  Administered 2019-08-21 – 2019-08-23 (×3): 80 mg via ORAL
  Filled 2019-08-21 (×3): qty 1

## 2019-08-21 MED ORDER — OXYTOCIN-SODIUM CHLORIDE 30-0.9 UT/500ML-% IV SOLN: 2.5000 [IU]/h | INTRAVENOUS | Status: AC

## 2019-08-21 MED ORDER — LACTATED RINGERS IV SOLN: INTRAVENOUS | Status: DC | PRN

## 2019-08-21 MED ORDER — SOD CITRATE-CITRIC ACID 500-334 MG/5ML PO SOLN: 30.0000 mL | Freq: Once | ORAL | Status: DC

## 2019-08-21 MED ORDER — KETOROLAC TROMETHAMINE 30 MG/ML IJ SOLN
30.0000 mg | Freq: Four times a day (QID) | INTRAMUSCULAR | Status: AC | PRN
  Administered 2019-08-21: 30 mg via INTRAMUSCULAR

## 2019-08-21 MED ORDER — SENNOSIDES-DOCUSATE SODIUM 8.6-50 MG PO TABS
2.0000 | ORAL_TABLET | ORAL | Status: DC
  Administered 2019-08-21 – 2019-08-23 (×3): 2 via ORAL
  Filled 2019-08-21 (×3): qty 2

## 2019-08-21 MED ORDER — FAMOTIDINE 20 MG PO TABS
ORAL_TABLET | ORAL | Status: AC
  Filled 2019-08-21: qty 1

## 2019-08-21 MED ORDER — CEFAZOLIN SODIUM-DEXTROSE 2-4 GM/100ML-% IV SOLN: 2.0000 g | INTRAVENOUS | Status: DC

## 2019-08-21 MED ORDER — MORPHINE SULFATE (PF) 0.5 MG/ML IJ SOLN
INTRAMUSCULAR | Status: AC
  Filled 2019-08-21: qty 10

## 2019-08-21 MED ORDER — DIPHENHYDRAMINE HCL 50 MG/ML IJ SOLN: 12.5000 mg | INTRAMUSCULAR | Status: DC | PRN

## 2019-08-21 MED ORDER — SIMETHICONE 80 MG PO CHEW
80.0000 mg | CHEWABLE_TABLET | ORAL | Status: DC | PRN
  Administered 2019-08-23: 80 mg via ORAL
  Filled 2019-08-21: qty 1

## 2019-08-21 MED ORDER — COCONUT OIL OIL: 1.0000 "application " | TOPICAL_OIL | Status: DC | PRN

## 2019-08-21 MED ORDER — ONDANSETRON HCL 4 MG/2ML IJ SOLN
INTRAMUSCULAR | Status: AC
  Filled 2019-08-21: qty 2

## 2019-08-21 MED ORDER — SODIUM CHLORIDE 0.9% FLUSH: 3.0000 mL | INTRAVENOUS | Status: DC | PRN

## 2019-08-21 MED ORDER — STERILE WATER FOR IRRIGATION IR SOLN
Status: DC | PRN
  Administered 2019-08-21: 1

## 2019-08-21 MED ORDER — SCOPOLAMINE 1 MG/3DAYS TD PT72
1.0000 | MEDICATED_PATCH | Freq: Once | TRANSDERMAL | Status: DC
  Administered 2019-08-21: 1.5 mg via TRANSDERMAL

## 2019-08-21 MED ORDER — KETOROLAC TROMETHAMINE 30 MG/ML IJ SOLN
30.0000 mg | Freq: Four times a day (QID) | INTRAMUSCULAR | Status: AC | PRN
  Administered 2019-08-21 – 2019-08-22 (×3): 30 mg via INTRAVENOUS
  Filled 2019-08-21 (×3): qty 1

## 2019-08-21 MED ORDER — ONDANSETRON HCL 4 MG/2ML IJ SOLN
INTRAMUSCULAR | Status: DC | PRN
  Administered 2019-08-21: 4 mg via INTRAVENOUS

## 2019-08-21 MED ORDER — DEXAMETHASONE SODIUM PHOSPHATE 4 MG/ML IJ SOLN
INTRAMUSCULAR | Status: DC | PRN
  Administered 2019-08-21: 4 mg via INTRAVENOUS

## 2019-08-21 MED ORDER — SODIUM CHLORIDE 0.9 % IV SOLN: INTRAVENOUS | Status: DC | PRN

## 2019-08-21 MED ORDER — SCOPOLAMINE 1 MG/3DAYS TD PT72
MEDICATED_PATCH | TRANSDERMAL | Status: AC
  Filled 2019-08-21: qty 1

## 2019-08-21 MED ORDER — DEXAMETHASONE SODIUM PHOSPHATE 10 MG/ML IJ SOLN
INTRAMUSCULAR | Status: AC
  Filled 2019-08-21: qty 1

## 2019-08-21 MED ORDER — ACETAMINOPHEN 500 MG PO TABS
1000.0000 mg | ORAL_TABLET | Freq: Four times a day (QID) | ORAL | Status: AC
  Administered 2019-08-21 – 2019-08-22 (×3): 1000 mg via ORAL
  Filled 2019-08-21 (×3): qty 2

## 2019-08-21 MED ORDER — ZOLPIDEM TARTRATE 5 MG PO TABS: 5.0000 mg | ORAL_TABLET | Freq: Every evening | ORAL | Status: DC | PRN

## 2019-08-21 MED ORDER — MEPERIDINE HCL 25 MG/ML IJ SOLN: 6.2500 mg | INTRAMUSCULAR | Status: DC | PRN

## 2019-08-21 MED ORDER — NALBUPHINE HCL 10 MG/ML IJ SOLN: 5.0000 mg | Freq: Once | INTRAMUSCULAR | Status: DC | PRN

## 2019-08-21 MED ORDER — CEFAZOLIN SODIUM-DEXTROSE 2-3 GM-%(50ML) IV SOLR
INTRAVENOUS | Status: DC | PRN
  Administered 2019-08-21: 2 g via INTRAVENOUS

## 2019-08-21 MED ORDER — FENTANYL CITRATE (PF) 100 MCG/2ML IJ SOLN
INTRAMUSCULAR | Status: AC
  Filled 2019-08-21: qty 2

## 2019-08-21 MED ORDER — NALOXONE HCL 0.4 MG/ML IJ SOLN: 0.4000 mg | INTRAMUSCULAR | Status: DC | PRN

## 2019-08-21 MED ORDER — LACTATED RINGERS IV SOLN: INTRAVENOUS | Status: DC

## 2019-08-21 MED ORDER — OXYCODONE HCL 5 MG PO TABS
5.0000 mg | ORAL_TABLET | ORAL | Status: DC | PRN
  Administered 2019-08-22 (×2): 5 mg via ORAL
  Administered 2019-08-23 (×3): 10 mg via ORAL
  Administered 2019-08-23 (×2): 5 mg via ORAL
  Filled 2019-08-21 (×3): qty 1
  Filled 2019-08-21 (×3): qty 2
  Filled 2019-08-21: qty 1

## 2019-08-21 MED ORDER — SCOPOLAMINE 1 MG/3DAYS TD PT72: 1.0000 | MEDICATED_PATCH | Freq: Once | TRANSDERMAL | Status: DC

## 2019-08-21 MED ORDER — NALOXONE HCL 4 MG/10ML IJ SOLN
1.0000 ug/kg/h | INTRAVENOUS | Status: DC | PRN
  Filled 2019-08-21: qty 5

## 2019-08-21 MED ORDER — CEFAZOLIN SODIUM-DEXTROSE 2-4 GM/100ML-% IV SOLN
INTRAVENOUS | Status: AC
  Filled 2019-08-21: qty 100

## 2019-08-21 MED ORDER — FAMOTIDINE 20 MG PO TABS
20.0000 mg | ORAL_TABLET | Freq: Once | ORAL | Status: AC
  Administered 2019-08-21: 20 mg via ORAL

## 2019-08-21 MED ORDER — FENTANYL CITRATE (PF) 100 MCG/2ML IJ SOLN
25.0000 ug | INTRAMUSCULAR | Status: DC | PRN
  Administered 2019-08-21: 50 ug via INTRAVENOUS

## 2019-08-21 MED ORDER — DIPHENHYDRAMINE HCL 25 MG PO CAPS: 25.0000 mg | ORAL_CAPSULE | Freq: Four times a day (QID) | ORAL | Status: DC | PRN

## 2019-08-21 MED ORDER — TETANUS-DIPHTH-ACELL PERTUSSIS 5-2.5-18.5 LF-MCG/0.5 IM SUSP: 0.5000 mL | Freq: Once | INTRAMUSCULAR | Status: DC

## 2019-08-21 MED ORDER — WITCH HAZEL-GLYCERIN EX PADS: 1.0000 "application " | MEDICATED_PAD | CUTANEOUS | Status: DC | PRN

## 2019-08-21 MED ORDER — FENTANYL CITRATE (PF) 100 MCG/2ML IJ SOLN
INTRAMUSCULAR | Status: DC | PRN
  Administered 2019-08-21: 15 ug via INTRATHECAL

## 2019-08-21 MED ORDER — SODIUM CHLORIDE 0.9 % IR SOLN
Status: DC | PRN
  Administered 2019-08-21: 1

## 2019-08-21 MED ORDER — DIBUCAINE (PERIANAL) 1 % EX OINT: 1.0000 "application " | TOPICAL_OINTMENT | CUTANEOUS | Status: DC | PRN

## 2019-08-21 MED ORDER — OXYTOCIN-SODIUM CHLORIDE 30-0.9 UT/500ML-% IV SOLN
INTRAVENOUS | Status: AC
  Filled 2019-08-21: qty 500

## 2019-08-21 MED ORDER — CHLORHEXIDINE GLUCONATE 0.12 % MT SOLN
15.0000 mL | Freq: Once | OROMUCOSAL | Status: AC
  Administered 2019-08-21: 15 mL via OROMUCOSAL
  Filled 2019-08-21: qty 15

## 2019-08-21 MED ORDER — ONDANSETRON HCL 4 MG/2ML IJ SOLN: 4.0000 mg | Freq: Three times a day (TID) | INTRAMUSCULAR | Status: DC | PRN

## 2019-08-21 MED ORDER — OXYTOCIN 10 UNIT/ML IJ SOLN
INTRAMUSCULAR | Status: DC | PRN
  Administered 2019-08-21: 30 [IU]

## 2019-08-21 MED ORDER — MORPHINE SULFATE (PF) 0.5 MG/ML IJ SOLN
INTRAMUSCULAR | Status: DC | PRN
  Administered 2019-08-21: .15 mg via INTRATHECAL

## 2019-08-21 MED ORDER — PHENYLEPHRINE HCL-NACL 20-0.9 MG/250ML-% IV SOLN
INTRAVENOUS | Status: DC | PRN
  Administered 2019-08-21: 20 ug/min via INTRAVENOUS

## 2019-08-21 MED ORDER — POVIDONE-IODINE 10 % EX SWAB: 2.0000 "application " | Freq: Once | CUTANEOUS | Status: DC

## 2019-08-21 MED ORDER — ALBUTEROL SULFATE (2.5 MG/3ML) 0.083% IN NEBU: 3.0000 mL | INHALATION_SOLUTION | Freq: Four times a day (QID) | RESPIRATORY_TRACT | Status: DC | PRN

## 2019-08-21 MED ORDER — KETOROLAC TROMETHAMINE 30 MG/ML IJ SOLN
INTRAMUSCULAR | Status: AC
  Filled 2019-08-21: qty 1

## 2019-08-21 SURGICAL SUPPLY — 39 items
BENZOIN TINCTURE PRP APPL 2/3 (GAUZE/BANDAGES/DRESSINGS) ×3 IMPLANT
CHLORAPREP W/TINT 26ML (MISCELLANEOUS) ×3 IMPLANT
CLAMP CORD UMBIL (MISCELLANEOUS) IMPLANT
CLOSURE STERI STRIP 1/2 X4 (GAUZE/BANDAGES/DRESSINGS) ×2 IMPLANT
CLOSURE WOUND 1/2 X4 (GAUZE/BANDAGES/DRESSINGS) ×1
CLOTH BEACON ORANGE TIMEOUT ST (SAFETY) ×3 IMPLANT
DRSG OPSITE POSTOP 4X10 (GAUZE/BANDAGES/DRESSINGS) ×6 IMPLANT
ELECT REM PT RETURN 9FT ADLT (ELECTROSURGICAL) ×3
ELECTRODE REM PT RTRN 9FT ADLT (ELECTROSURGICAL) ×1 IMPLANT
EXTRACTOR VACUUM M CUP 4 TUBE (SUCTIONS) IMPLANT
EXTRACTOR VACUUM M CUP 4' TUBE (SUCTIONS)
GAUZE SPONGE 4X4 12PLY STRL LF (GAUZE/BANDAGES/DRESSINGS) ×6 IMPLANT
GLOVE BIOGEL PI IND STRL 7.0 (GLOVE) ×2 IMPLANT
GLOVE BIOGEL PI IND STRL 7.5 (GLOVE) ×2 IMPLANT
GLOVE BIOGEL PI INDICATOR 7.0 (GLOVE) ×4
GLOVE BIOGEL PI INDICATOR 7.5 (GLOVE) ×4
GLOVE ECLIPSE 7.5 STRL STRAW (GLOVE) ×3 IMPLANT
GOWN STRL REUS W/TWL LRG LVL3 (GOWN DISPOSABLE) ×9 IMPLANT
KIT ABG SYR 3ML LUER SLIP (SYRINGE) IMPLANT
NEEDLE HYPO 25X5/8 SAFETYGLIDE (NEEDLE) IMPLANT
NS IRRIG 1000ML POUR BTL (IV SOLUTION) ×3 IMPLANT
PACK C SECTION WH (CUSTOM PROCEDURE TRAY) ×3 IMPLANT
PAD ABD 7.5X8 STRL (GAUZE/BANDAGES/DRESSINGS) ×3 IMPLANT
PAD OB MATERNITY 4.3X12.25 (PERSONAL CARE ITEMS) ×3 IMPLANT
PENCIL BUTTON HOLSTER BLD 10FT (ELECTRODE) ×3 IMPLANT
PENCIL SMOKE EVAC W/HOLSTER (ELECTROSURGICAL) ×3 IMPLANT
RTRCTR C-SECT PINK 25CM LRG (MISCELLANEOUS) ×3 IMPLANT
STRIP CLOSURE SKIN 1/2X4 (GAUZE/BANDAGES/DRESSINGS) ×2 IMPLANT
SUT MNCRL 0 VIOLET CTX 36 (SUTURE) ×1 IMPLANT
SUT MONOCRYL 0 CTX 36 (SUTURE) ×2
SUT VIC AB 0 CT1 36 (SUTURE) ×3 IMPLANT
SUT VIC AB 0 CTX 36 (SUTURE) ×4
SUT VIC AB 0 CTX36XBRD ANBCTRL (SUTURE) ×2 IMPLANT
SUT VIC AB 2-0 CT1 27 (SUTURE) ×2
SUT VIC AB 2-0 CT1 TAPERPNT 27 (SUTURE) ×1 IMPLANT
SUT VIC AB 4-0 KS 27 (SUTURE) ×3 IMPLANT
TOWEL OR 17X24 6PK STRL BLUE (TOWEL DISPOSABLE) ×3 IMPLANT
TRAY FOLEY W/BAG SLVR 14FR LF (SET/KITS/TRAYS/PACK) ×3 IMPLANT
WATER STERILE IRR 1000ML POUR (IV SOLUTION) ×3 IMPLANT

## 2019-08-21 NOTE — Discharge Instructions (Signed)

## 2019-08-21 NOTE — Progress Notes (Signed)
[name] was referred for history of depression/anxiety. * Referral screened out by Clinical Social Worker because none of the following criteria appear to apply: ~ History of anxiety/depression during this pregnancy, or of post-partum depression following prior delivery. ~ Diagnosis of anxiety and/or depression within last 3 years. Per further chart review, it appears that [name] was diagnose with depression in 2017.  OR * [name]'s symptoms currently being treated with medication and/or therapy.'    Please contact the Clinical Social Worker if needs arise, by [facility] request, or if [name] scores greater than 9/yes to question 10 on Edinburgh Postpartum Depression Screen.   Erica Harrell, MSW, LCSW [facility] [phone]

## 2019-08-21 NOTE — Op Note (Signed)
Operative Note   SURGERY DATE: 08/21/2019  PRE-OP DIAGNOSIS:  *Pregnancy at 39 weeks *H/o Cesarean delivery x2 *Repeat Elective Cesarean Delivery  POST-OP DIAGNOSIS:  *Pregnancy at 39 weeks *H/o Cesarean delivery x2 *Cesarean Delivery, Delivered *Uterine Window  PROCEDURE: repeat low transverse cesarean section via pfannenstiel skin incision with double layer uterine closure  SURGEON: Surgeon(s) and Role:    * Wouk, Wilfred Curtis, MD - Primary    * Aristea Posada L, DO - Resident - Assisting  ASSISTANT: None  ANESTHESIA: spinal  ESTIMATED BLOOD LOSS: 107 mL  DRAINS: 150 mL UOP, clear, via indwelling foley  TOTAL IV FLUIDS: 1500 mL crystalloid  VTE PROPHYLAXIS: SCDs to bilateral lower extremities  ANTIBIOTICS: Two grams of Cefazolin were given, within 1 hour of skin incision  SPECIMENS: None  COMPLICATIONS: None immediate  INDICATIONS: Repeat Elective Cesarean, h/o Cesarean delivery x2  FINDINGS: No intra-abdominal adhesions were noted. Grossly normal uterus (with a large 4 cm x 8 cm uterine window), tubes and ovaries. Clear amniotic fluid, cephalic ROP female infant, weight per medical record, APGARs 8/8, intact placenta.  PROCEDURE IN DETAIL: The patient was taken to the operating room where anesthesia was administered and normal fetal heart tones were confirmed. She was then prepped and draped in the normal fashion in the dorsal supine position with a leftward tilt.  After a time out was performed, a pfannensteil skin incision was made with the scalpel and carried through to the underlying layer of fascia. The fascia was then incised at the midline and this incision was extended laterally with the mayo scissors. Attention was turned to the superior aspect of the fascial incision which was grasped with the kocher clamps x 2, tented up and the rectus muscles were dissected off with the mayo scissors. In a similar fashion the inferior aspect of the fascial incision was  grasped with the kocher clamps, tented up and the rectus muscles dissected off with the mayo scissors. The rectus muscles were then separated in the midline and the peritoneum was entered bluntly. The Alexis retractor was inserted and a large 4 cm x 8 cm uterine window was noted across the lower uterine segment. Careful examination showed the vesicouterine peritoneum was well below the inferior aspect of the uterine window.  A small low transverse hysterotomy was made with the scalpel, and the endometrial cavity was breached bluntly. The amniotic sac ruptured, yielding clear amniotic fluid. This incision was extended bluntly and the infant's head, shoulders and body were delivered atraumatically.The cord was clamped x 2 and cut, and the infant was handed to the awaiting pediatricians, after delayed cord clamping was done.  The placenta was then gradually expressed from the uterus and then the uterus was cleared of all clots and debris. The hysterotomy was repaired with a running suture of 0 Vicryl. One figure-of-eight suture of 0 Vicryl was added to achieve excellent hemostasis. Then a second imbricating layer of 0 Vicryl suture was placed, and the hysterotomy and all operative sites were reinspected and excellent hemostasis was noted after irrigation and suction of the abdomen with warm saline.  The peritoneum was closed with a running stitch of 3-0 Vicryl. The fascia was reapproximated with 0 Vicryl in a simple running fashion bilaterally. The skin was then closed with 4-0 Vicryl, in a subcuticular fashion.  The patient  tolerated the procedure well. Sponge, lap, needle, and instrument counts were correct x 2. The patient and her partner were informed about the uterine window after the operation was  complete, and she was transferred to the recovery room awake, alert and breathing independently in stable condition.  Marlowe Alt, DO OB Fellow Center for [employer] Midwife)

## 2019-08-21 NOTE — Anesthesia Procedure Notes (Signed)
Spinal  Patient location during procedure: OR Start time: 08/21/2019 12:26 PM End time: 08/21/2019 12:29 PM Staffing Performed: anesthesiologist  Anesthesiologist: Cecile Hearing, MD Preanesthetic Checklist Completed: patient identified, IV checked, risks and benefits discussed, surgical consent, monitors and equipment checked, pre-op evaluation and timeout performed Spinal Block Patient position: sitting Prep: DuraPrep and site prepped and draped Patient monitoring: continuous pulse ox and blood pressure Approach: midline Location: L3-4 Injection technique: single-shot Needle Needle type: Pencan  Needle gauge: 24 G Assessment Sensory level: T6 Additional Notes Functioning IV was confirmed and monitors were applied. Sterile prep and drape, including hand hygiene, mask and sterile gloves were used. The patient was positioned and the spine was prepped. The skin was anesthetized with lidocaine.  Free flow of clear CSF was obtained prior to injecting local anesthetic into the CSF.  The spinal needle aspirated freely following injection.  The needle was carefully withdrawn.  The patient tolerated the procedure well. Consent was obtained prior to procedure with all questions answered and concerns addressed. Risks including but not limited to bleeding, infection, nerve damage, paralysis, failed block, inadequate analgesia, allergic reaction, high spinal, itching and headache were discussed and the patient wished to proceed.   Arrie Aran, MD

## 2019-08-21 NOTE — Transfer of Care (Addendum)
Immediate Anesthesia Transfer of Care Note  Patient: Erica Harrell  Procedure(s) Performed: CESAREAN SECTION (N/A )  Patient Location: PACU  Anesthesia Type:Spinal  Level of Consciousness: awake, alert  and oriented  Airway & Oxygen Therapy: Patient Spontanous Breathing  Post-op Assessment: Report given to RN and Post -op Vital signs reviewed and stable  Post vital signs: Reviewed and stable  Last Vitals:  Vitals Value Taken Time  BP 113/75 08/21/19 1331  Temp    Pulse 81 08/21/19 1335  Resp 16 08/21/19 1335  SpO2 100 % 08/21/19 1335  Vitals shown include unvalidated device data.  Last Pain:  Vitals:   08/21/19 1040  TempSrc: Oral         Complications: No complications documented.

## 2019-08-21 NOTE — Anesthesia Preprocedure Evaluation (Addendum)
Anesthesia Evaluation  Patient identified by MRN, date of birth, ID band Patient awake    Reviewed: Allergy & Precautions, NPO status , Patient's Chart, lab work & pertinent test results  Airway Mallampati: II  TM Distance: >3 FB Neck ROM: Full    Dental  (+) Teeth Intact, Dental Advisory Given, Missing   Pulmonary asthma , former smoker,    Pulmonary exam normal breath sounds clear to auscultation       Cardiovascular Exercise Tolerance: Good negative cardio ROS Normal cardiovascular exam Rhythm:Regular Rate:Normal     Neuro/Psych  Headaches, PSYCHIATRIC DISORDERS Depression    GI/Hepatic Neg liver ROS, GERD  Medicated and Controlled,  Endo/Other  Obesity   Renal/GU negative Renal ROS     Musculoskeletal negative musculoskeletal ROS (+)   Abdominal   Peds  Hematology  (+) Blood dyscrasia, anemia , Plt 212k   Anesthesia Other Findings   Reproductive/Obstetrics (+) Pregnancy                            Anesthesia Physical Anesthesia Plan  ASA: III  Anesthesia Plan: Spinal   Post-op Pain Management:    Induction:   PONV Risk Score and Plan: 2 and Treatment may vary due to age or medical condition, Dexamethasone, Ondansetron and Scopolamine patch - Pre-op  Airway Management Planned: Natural Airway and Nasal Cannula  Additional Equipment:   Intra-op Plan:   Post-operative Plan:   Informed Consent: I have reviewed the patients History and Physical, chart, labs and discussed the procedure including the risks, benefits and alternatives for the proposed anesthesia with the patient or authorized representative who has indicated his/her understanding and acceptance.     Dental advisory given  Plan Discussed with: CRNA, Anesthesiologist and Surgeon  Anesthesia Plan Comments: (Discussed risks and benefits of and differences between spinal and general. Discussed risks of spinal  including headache, backache, failure, bleeding, infection, and nerve damage. Patient consents to spinal. Questions answered. Coagulation studies and platelet count acceptable.)        Anesthesia Quick Evaluation

## 2019-08-21 NOTE — Discharge Summary (Addendum)
Postpartum Discharge Summary    Patient Name: Erica Harrell DOB: Jul 26, 1995 MRN: [id number]  Date of admission: 08/21/2019 Delivery date:08/21/2019  Delivering provider: Laurey Arrow BED[place]ORD  Date of discharge: 08/24/2019  Admitting diagnosis: History of 2 cesarean sections [Z98.891] Intrauterine pregnancy: [redacted]w[redacted]d    Secondary diagnosis:  Active Problems:   Supervision of low-risk pregnancy   Status post repeat low transverse cesarean section   Asthma affecting pregnancy in third trimester   Insertion of implantable subdermal contraceptive  Additional problems: 4x8 cm uterine window noted at time of operation   Discharge diagnosis: Term Pregnancy Delivered                                              Post partum procedures: Nexplanon Augmentation: N/A Complications: None  Hospital course: Sceduled C/S   24y.o. yo [id number] admitted to the hospital 08/21/2019 for scheduled cesarean section with the following indication:Elective Repeat.Delivery details are as follows:  Membrane Rupture Time/Date: 12:49 PM ,08/21/2019   Delivery Method:C-Section, Low Transverse  Details of operation can be found in separate operative note.  Patient had an uncomplicated postpartum course. Started on PO iron. She is ambulating, tolerating a regular diet, passing flatus, and urinating well. Patient is discharged home in stable condition on  08/24/19        Newborn Data: Birth date:08/21/2019  Birth time:12:49 PM  Gender:Female  Living status:Living  Apgars:8 ,8  Weight:3561 g     Magnesium Sulfate received: No BMZ received: No Rhophylac:N/A MMR:N/A T-DaP:Given prenatally Flu: N/A Transfusion:No  Physical exam  Vitals:   08/23/19 0500 08/23/19 1440 08/23/19 2207 08/24/19 0500  BP: 98/63 99/68 102/70 99/62  Pulse: (!) 54 (!) 54 60 65  Resp: 20 18 18 18   Temp: 98.2 F (36.8 C) 97.8 F (36.6 C) 98.3 F (36.8 C) 98.5 F (36.9 C)  TempSrc: Oral Oral Oral Oral  SpO2:  100%    Weight:       Height:       General: alert, cooperative and no distress Lochia: appropriate Uterine Fundus: firm Incision: Dressing is clean, dry, and intact DVT Evaluation: No evidence of DVT seen on physical exam. No significant calf/ankle edema. Labs: Lab Results  Component Value Date   WBC 12.6 (H) 08/22/2019   HGB 9.6 (L) 08/22/2019   HCT 29.4 (L) 08/22/2019   MCV 88.8 08/22/2019   PLT 194 08/22/2019   CMP Latest Ref Rng & Units 08/21/2019  Glucose 65 - 99 mg/dL -  BUN 6 - 20 mg/dL -  Creatinine 0.44 - 1.00 mg/dL 0.48  Sodium 135 - 145 mmol/L -  Potassium 3.5 - 5.1 mmol/L -  Chloride 101 - 111 mmol/L -  CO2 22 - 32 mmol/L -  Calcium 8.9 - 10.3 mg/dL -  Total Protein 6.5 - 8.1 g/dL -  Total Bilirubin 0.3 - 1.2 mg/dL -  Alkaline Phos 38 - 126 U/L -  AST 15 - 41 U/L -  ALT 14 - 54 U/L -   Edinburgh Score: Edinburgh Postnatal Depression Scale Screening Tool 08/22/2019  I have been able to laugh and see the funny side of things. 0  I have looked forward with enjoyment to things. 0  I have blamed myself unnecessarily when things went wrong. 0  I have been anxious or worried for no good reason. 0  I have  felt scared or panicky for no good reason. 0  Things have been getting on top of me. 0  I have been so unhappy that I have had difficulty sleeping. 0  I have felt sad or miserable. 0  I have been so unhappy that I have been crying. 0  The thought of harming myself has occurred to me. 0  Edinburgh Postnatal Depression Scale Total 0     After visit meds:  Allergies as of 08/24/2019      Reactions   Shellfish Allergy Anaphylaxis   Zoloft [sertraline Hcl] Anaphylaxis      Medication List    TAKE these medications   acetaminophen 500 MG tablet Commonly known as: TYLENOL Take 500 mg by mouth every 6 (six) hours as needed for mild pain or moderate pain.   albuterol 108 (90 Base) MCG/ACT inhaler Commonly known as: VENTOLIN HFA Inhale 2 puffs into the lungs every 6 (six) hours  as needed for wheezing or shortness of breath.   Blood Pressure Kit Devi 1 Device by Does not apply route as needed.   famotidine 20 MG tablet Commonly known as: PEPCID Take 1 tablet (20 mg total) by mouth 2 (two) times daily.   ferrous sulfate 325 (65 FE) MG tablet Take 1 tablet (325 mg total) by mouth every other day.   ibuprofen 800 MG tablet Commonly known as: ADVIL Take 1 tablet (800 mg total) by mouth every 8 (eight) hours.   oxyCODONE 5 MG immediate release tablet Commonly known as: Oxy IR/ROXICODONE Take 1 tablet (5 mg total) by mouth every 4 (four) hours as needed for moderate pain.   pantoprazole 40 MG tablet Commonly known as: Protonix Take 1 tablet (40 mg total) by mouth daily.   polyethylene glycol powder 17 GM/SCOOP powder Commonly known as: GLYCOLAX/MIRALAX Take 17 g by mouth daily as needed.   PrePLUS 27-1 MG Tabs Take 1 tablet by mouth daily.        Discharge home in stable condition Infant Feeding: Bottle Infant Disposition:home with mother Discharge instruction: per After Visit Summary and Postpartum booklet. Activity: Advance as tolerated. Pelvic rest for 6 weeks.  Diet: routine diet Future Appointments:No future appointments. Follow up Visit:   Please schedule this patient for a In person postpartum visit in 4 weeks with the following provider: Any provider. Additional Postpartum F/U:Incision check 1 week  Low risk pregnancy complicated by: h/o Cesarean delivery Delivery mode:  C-Section, Low Transverse  Anticipated Birth Control:  Nexplanon   08/24/2019 Clarnce Flock, MD

## 2019-08-21 NOTE — H&P (Signed)
OBSTETRIC ADMISSION HISTORY AND PHYSICAL  Erica Harrell is a 24 y.o. female [phone] with IUP at 67w0dpresenting for repeat C-section. She reports +FMs. No LOF, VB, blurry vision, headaches, peripheral edema, or RUQ pain. She plans on bottle feeding. She requests Bexplanon for birth control.  Dating: By LMP --->  Estimated Date of Delivery: 08/28/19  Sono:   _0 , normal anatomy, cephalic presentation, 6539J 68%ile, EFW 1#8  Prenatal History/Complications: Asthma H/o CS x2  Past Medical History: Past Medical History:  Diagnosis Date  . Asthma   . Bacterial vaginosis   . Constipation   . Depression   . Headache(784.0)   . Pelvic inflammatory disease   . Seasonal allergies     Past Surgical History: Past Surgical History:  Procedure Laterality Date  . CESAREAN SECTION    . CESAREAN SECTION N/A 05/27/2015   Procedure: CESAREAN SECTION;  Surgeon: JWoodroe Mode MD;  [facility];  Service: Obstetrics;  Laterality: N/A;  . VAGINAL DELIVERY      Obstetrical History: OB History    Gravida  4   Para  2   Term  2   Preterm      AB  1   Living  2     SAB  1   TAB      Ectopic      Multiple  0   Live Births  2        Obstetric Comments  Elective C/S        Social History: Social History   Socioeconomic History  . Marital status: Single    Spouse name: Not on file  . Number of children: Not on file  . Years of education: Not on file  . Highest education level: Not on file  Occupational History  . Not on file  Tobacco Use  . Smoking status: Former Smoker    Types: Cigarettes    Quit date: 12/21/2018    Years since quitting: 0.6  . Smokeless tobacco: Never Used  Vaping Use  . Vaping Use: Never used  Substance and Sexual Activity  . Alcohol use: No  . Drug use: No  . Sexual activity: Yes    Birth control/protection: None  Other Topics Concern  . Not on file  Social History Narrative  . Not on file   Social Determinants of Health    Financial Resource Strain:   . Difficulty of Paying Living Expenses:   Food Insecurity: No Food Insecurity  . Worried About RCharity fundraiserin the Last Year: Never true  . Ran Out of Food in the Last Year: Never true  Transportation Needs: No Transportation Needs  . Lack of Transportation (Medical): No  . Lack of Transportation (Non-Medical): No  Physical Activity:   . Days of Exercise per Week:   . Minutes of Exercise per Session:   Stress:   . Feeling of Stress :   Social Connections:   . Frequency of Communication with Friends and Family:   . Frequency of Social Gatherings with Friends and Family:   . Attends Religious Services:   . Active Member of Clubs or Organizations:   . Attends CArchivistMeetings:   .Marland KitchenMarital Status:     Family History: Family History  Problem Relation Age of Onset  . Asthma Mother   . Diabetes Maternal Aunt   . Diabetes Maternal Uncle   . Hypertension Maternal Grandmother   . Diabetes Maternal Grandmother   . Asthma  Maternal Grandmother     Allergies: Allergies  Allergen Reactions  . Shellfish Allergy Anaphylaxis  . Zoloft [Sertraline Hcl] Anaphylaxis    Medications Prior to Admission  Medication Sig Dispense Refill Last Dose  . acetaminophen (TYLENOL) 500 MG tablet Take 500 mg by mouth every 6 (six) hours as needed for mild pain or moderate pain.   Past Week at Unknown time  . albuterol (VENTOLIN HFA) 108 (90 Base) MCG/ACT inhaler Inhale 2 puffs into the lungs every 6 (six) hours as needed for wheezing or shortness of breath. 0.09 g 1 Past Week at Unknown time  . famotidine (PEPCID) 20 MG tablet Take 1 tablet (20 mg total) by mouth 2 (two) times daily. 60 tablet 3 Past Month at Unknown time  . Prenatal Vit-Fe Fumarate-FA (PREPLUS) 27-1 MG TABS Take 1 tablet by mouth daily. 30 tablet 6 Past Month at Unknown time  . Blood Pressure Monitoring (BLOOD PRESSURE KIT) DEVI 1 Device by Does not apply route as needed. 1 each 0   .  pantoprazole (PROTONIX) 40 MG tablet Take 1 tablet (40 mg total) by mouth daily. 60 tablet 1 Not Taking at Unknown time     Review of Systems:  All systems reviewed and negative except as stated in HPI  PE: Blood pressure 110/79, pulse 93, temperature 98.9 F (37.2 C), temperature source Oral, resp. rate 18, height _0  (1.626 m), weight 87.7 kg, last menstrual period 11/21/2018, SpO2 100 %, not currently breastfeeding. General appearance: alert and cooperative Lungs: regular rate and effort Heart: regular rate  Abdomen: soft, non-tender Extremities: Homans sign is negative, no sign of DVT Presentation: cephalic Heart tones confirmed by Doppler  Prenatal labs: ABO, Rh: --/--/AB POS, AB POS Performed at [facility], [address], [place], [place] [address]  [id number] [id number]) Antibody: NEG (06/28 0932) Rubella: 16.20 (01/07 1650) RPR: NON REACTIVE (06/28 0924)  HBsAg: Negative (01/07 1650)  HIV: Non Reactive (04/21 0933)  GBS: Positive/-- (06/18 1046)  2 hr GTT 90/162/114  Prenatal Transfer Tool  Maternal Diabetes: No Genetic Screening: Normal Maternal Ultrasounds/Referrals: Normal Fetal Ultrasounds or other Referrals:  None Maternal Substance Abuse:  No Significant Maternal Medications:  None Significant Maternal Lab Results: Group B Strep positive  No results found for this or any previous visit (from the past 24 hour(s)).  Patient Active Problem List   Diagnosis Date Noted  . Group B streptococcal carriage complicating pregnancy 67/01/4579  . Asthma affecting pregnancy in third trimester 08/09/2019  . Supervision of low-risk pregnancy 01/21/2019  . History of 2 cesarean sections 01/21/2019    Assessment: Erica Harrell is a 24 y.o. [phone] at 46w0dhere for repeat Cesarean delivery   Plan: The risks of cesarean section were discussed with the patient including but were not limited to: bleeding which may require transfusion or reoperation; infection which  may require antibiotics; injury to bowel, bladder, ureters or other surrounding organs; injury to the fetus; need for additional procedures including hysterectomy in the event of a life-threatening hemorrhage; placental abnormalities wth subsequent pregnancies, incisional problems, thromboembolic phenomenon and other postoperative/anesthesia complications.  The patient concurred with the proposed plan, giving informed written consent for the procedures.  Patient has been NPO since 9 PM last night she will remain NPO for procedure. Anesthesia and OR aware.  Preoperative prophylactic antibiotics and SCDs ordered on call to the OR.  To OR when ready.   Rosalea Withrow L Lakesia Dahle, DO  08/21/2019, 11:26 AM

## 2019-08-21 NOTE — Anesthesia Postprocedure Evaluation (Signed)
Anesthesia Post Note  Patient: Paulette Rockford  Procedure(s) Performed: CESAREAN SECTION (N/A )     Patient location during evaluation: PACU Anesthesia Type: Spinal Level of consciousness: oriented, awake and alert and awake Pain management: pain level controlled Vital Signs Assessment: post-procedure vital signs reviewed and stable Respiratory status: spontaneous breathing, respiratory function stable, patient connected to nasal cannula oxygen and nonlabored ventilation Cardiovascular status: blood pressure returned to baseline and stable Postop Assessment: no headache, no backache, no apparent nausea or vomiting, patient able to bend at knees and spinal receding Anesthetic complications: no   No complications documented.  Last Vitals:  Vitals:   08/21/19 1440 08/21/19 1545  BP: 109/74 117/76  Pulse: 70 (!) 59  Resp:  16  Temp: 36.9 C 36.9 C  SpO2: 100% 98%    Last Pain:  Vitals:   08/21/19 1545  TempSrc: Oral  PainSc: 4    Pain Goal:                   Cecile Hearing

## 2019-08-22 ENCOUNTER — Encounter: Payer: Medicaid Other | Admitting: Medical

## 2019-08-22 LAB — CBC
HCT: 29.4 % — ABNORMAL LOW (ref 36.0–46.0)
Hemoglobin: 9.6 g/dL — ABNORMAL LOW (ref 12.0–15.0)
MCH: 29 pg (ref 26.0–34.0)
MCHC: 32.7 g/dL (ref 30.0–36.0)
MCV: 88.8 fL (ref 80.0–100.0)
Platelets: 194 10*3/uL (ref 150–400)
RBC: 3.31 MIL/uL — ABNORMAL LOW (ref 3.87–5.11)
RDW: 11.9 % (ref 11.5–15.5)
WBC: 12.6 10*3/uL — ABNORMAL HIGH (ref 4.0–10.5)
nRBC: 0 % (ref 0.0–0.2)

## 2019-08-22 LAB — BIRTH TISSUE RECOVERY COLLECTION (PLACENTA DONATION)

## 2019-08-22 MED ORDER — IBUPROFEN 800 MG PO TABS
800.0000 mg | ORAL_TABLET | Freq: Three times a day (TID) | ORAL | Status: DC
  Administered 2019-08-22 – 2019-08-24 (×5): 800 mg via ORAL
  Filled 2019-08-22 (×5): qty 1

## 2019-08-22 MED ORDER — FERROUS SULFATE 325 (65 FE) MG PO TABS
325.0000 mg | ORAL_TABLET | ORAL | Status: DC
  Administered 2019-08-22 – 2019-08-24 (×2): 325 mg via ORAL
  Filled 2019-08-22 (×2): qty 1

## 2019-08-22 MED ORDER — ACETAMINOPHEN 325 MG PO TABS
650.0000 mg | ORAL_TABLET | Freq: Four times a day (QID) | ORAL | Status: DC
  Administered 2019-08-22 – 2019-08-24 (×8): 650 mg via ORAL
  Filled 2019-08-22 (×8): qty 2

## 2019-08-22 NOTE — Progress Notes (Signed)
POSTPARTUM PROGRESS NOTE  POD #1  Subjective:  Erica Harrell is a 24 y.o. [phone] s/p rLTCS at [redacted]w[redacted]d.  She reports she doing well. No acute events overnight. She denies any problems with ambulating, voiding or po intake. Denies nausea or vomiting. She has passed flatus. Pain is moderately controlled.  Lochia is less than a period.  Objective: Blood pressure 92/63, pulse (!) 56, temperature 98.3 F (36.8 C), temperature source Oral, resp. rate 20, height 5\' 4"  (1.626 m), weight 87.7 kg, last menstrual period 11/21/2018, SpO2 100 %, unknown if currently breastfeeding.  Physical Exam:  General: alert, cooperative and no distress Chest: no respiratory distress Heart:regular rate, distal pulses intact Abdomen: soft, nontender,  Uterine Fundus: firm, appropriately tender DVT Evaluation: No calf swelling or tenderness Extremities: no LE edema Skin: warm, dry; incision clean/dry/intact w/ honeycomb dressing in place  Recent Labs    08/21/19 1521 08/22/19 0535  HGB 11.7* 9.6*  HCT 36.3 29.4*    Assessment/Plan: Erica Harrell is a 24 y.o. 25 s/p rLTCS at [redacted]w[redacted]d for : elective repeat.  POD#1 - Doing welll; pain moderately controlled, encouraged to use oxycodone. H/H appropriate  Routine postpartum care  OOB, ambulated  Lovenox for VTE prophylaxis Anemia: asymptomatic  Start po ferrous sulfate every other day Contraception: Nexplanon to be placed prior to discharge Feeding: bottle  Dispo: Plan for discharge POD#2.   LOS: 1 day   [redacted]w[redacted]d, MD/MP[facility] OB Fellow  08/22/2019, 6:37 AM

## 2019-08-23 DIAGNOSIS — Z30017 Encounter for initial prescription of implantable subdermal contraceptive: Secondary | ICD-10-CM

## 2019-08-23 MED ORDER — FERROUS SULFATE 325 (65 FE) MG PO TABS: 325.0000 mg | ORAL_TABLET | ORAL | 1 refills | Status: DC

## 2019-08-23 MED ORDER — IBUPROFEN 800 MG PO TABS: 800.0000 mg | ORAL_TABLET | Freq: Three times a day (TID) | ORAL | 0 refills | Status: DC

## 2019-08-23 MED ORDER — LIDOCAINE HCL 1 % IJ SOLN
0.0000 mL | Freq: Once | INTRAMUSCULAR | Status: AC | PRN
  Administered 2019-08-23: 20 mL via INTRADERMAL
  Filled 2019-08-23: qty 20

## 2019-08-23 MED ORDER — POLYETHYLENE GLYCOL 3350 17 GM/SCOOP PO POWD
17.0000 g | Freq: Every day | ORAL | 1 refills | Status: DC | PRN
Start: 2019-08-23 — End: 2022-09-11

## 2019-08-23 MED ORDER — ETONOGESTREL 68 MG ~~LOC~~ IMPL
68.0000 mg | DRUG_IMPLANT | Freq: Once | SUBCUTANEOUS | Status: AC
  Administered 2019-08-23: 68 mg via SUBCUTANEOUS
  Filled 2019-08-23: qty 1

## 2019-08-23 MED ORDER — OXYCODONE HCL 5 MG PO TABS: 5.0000 mg | ORAL_TABLET | ORAL | 0 refills | Status: DC | PRN

## 2019-08-23 NOTE — Progress Notes (Signed)
POSTPARTUM PROGRESS NOTE  POD #2  Subjective:  Erica Harrell is a 24 y.o. [phone] s/p rLTCS at [redacted]w[redacted]d.  She reports she doing well. No acute events overnight. She denies any problems with ambulating, voiding or po intake. Denies nausea or vomiting. She has passed flatus. Pain is well controlled.  Lochia is less than a period.  Objective: Blood pressure 98/63, pulse (!) 54, temperature 98.2 F (36.8 C), temperature source Oral, resp. rate 20, height 5\' 4"  (1.626 m), weight 87.7 kg, last menstrual period 11/21/2018, SpO2 100 %, unknown if currently breastfeeding.  Physical Exam:  General: alert, cooperative and no distress Chest: no respiratory distress Heart:regular rate, distal pulses intact Abdomen: soft, nontender,  Uterine Fundus: firm, appropriately tender DVT Evaluation: No calf swelling or tenderness Extremities: no LE edema Skin: warm, dry; incision clean/dry/intact w/ honeycomb dressing in place  Recent Labs    08/21/19 1521 08/22/19 0535  HGB 11.7* 9.6*  HCT 36.3 29.4*    Assessment/Plan: Erica Harrell is a 24 y.o. 25 s/p rLTCS at [redacted]w[redacted]d for history of two prior cesareans.  POD#2 - Doing welll; pain well controlled. H/H appropriate  Routine postpartum care  OOB, ambulated  Lovenox for VTE prophylaxis Anemia: asymptomatic  Start po iron Contraception: nexplanon to be placed Feeding: bottle  Dispo: Plan for discharge PPD#3 (infant not yet discharged).   LOS: 2 days   [redacted]w[redacted]d, MD/[facility] OB Fellow  08/23/2019, 8:26 AM

## 2019-08-23 NOTE — Procedures (Addendum)
Post-Placental Nexplanon Insertion Procedure Note  Patient was identified. Informed consent was signed, signed copy in chart. A time-out was performed.    The insertion site was identified 8-10 cm (3-4 inches) from the medial epicondyle of the humerus and 3-5 cm (1.25-2 inches) posterior to (below) the sulcus (groove) between the biceps and triceps muscles of the patient's Right arm and marked. The site was prepped and draped in the usual sterile fashion. Pt was prepped with alcohol swab and then injected with 3 cc of 1% lidocaine. The site was prepped with betadine. Nexplanon removed form packaging,  Device confirmed in needle, then inserted full length of needle and withdrawn per handbook instructions. Provider and patient verified presence of the implant in the woman's arm by palpation. Pt insertion site was covered with steristrips/adhesive bandage and pressure bandage. There was minimal blood loss. Patient tolerated procedure well.  Patient was given post procedure instructions and Nexplanon user card with expiration date. Condoms were recommended for STI prevention. Patient was asked to keep the pressure dressing on for 24 hours to minimize bruising and keep the adhesive bandage on for 3-5 days. The patient verbalized understanding of the plan of care and agrees.   Lot # [id number] Expiration Date 11/17/2021  Evelena Leyden, DO PGY1 Family Medicine Resident  GME ATTESTATION:  I saw and evaluated the patient. I was gloved and supervised the resident throughout the entire procedure. I agree with the findings and the plan of care as documented in the resident's note.  Marlowe Alt, DO OB Fellow, Faculty [facility], [facility] 08/23/2019 12:16 PM

## 2019-08-24 NOTE — Progress Notes (Signed)
Patient discharged with post instructions. Reviewed when to call MD/go to MAU, pre e signs/symptoms, gave handout, went over medications and all discharge instructions. Pt stated would call MD for post op appointment within 4-6 weeks. Pt and FOB asked appropriate questions. Pt left  In good condition, ambulatory with FOB and newborn.

## 2019-09-03 ENCOUNTER — Telehealth: Payer: Self-pay | Admitting: *Deleted

## 2019-09-03 NOTE — Telephone Encounter (Signed)
Pt called earlier this afternoon and spoke with Rolan Lipa. She was requesting alternate pain medication prescription and also stated she was having trouble using the bathroom. I attempted call back to pt and got her Voicemail. I left a message stating that I would send a MyChart message.

## 2019-09-09 ENCOUNTER — Ambulatory Visit: Payer: Medicaid Other

## 2019-10-03 ENCOUNTER — Ambulatory Visit: Payer: Medicaid Other

## 2020-03-07 ENCOUNTER — Emergency Department (HOSPITAL_COMMUNITY): Payer: Medicaid Other

## 2020-03-07 ENCOUNTER — Other Ambulatory Visit: Payer: Self-pay

## 2020-03-07 ENCOUNTER — Emergency Department (HOSPITAL_COMMUNITY)
Admission: EM | Admit: 2020-03-07 | Discharge: 2020-03-07 | Disposition: A | Discharge status: Home / Self Care | Payer: Medicaid Other | Attending: Emergency Medicine | Admitting: Emergency Medicine

## 2020-03-07 ENCOUNTER — Encounter (HOSPITAL_COMMUNITY): Payer: Self-pay | Admitting: Emergency Medicine

## 2020-03-07 DIAGNOSIS — J45909 Unspecified asthma, uncomplicated: Secondary | ICD-10-CM | POA: Diagnosis not present

## 2020-03-07 DIAGNOSIS — S0990XA Unspecified injury of head, initial encounter: Secondary | ICD-10-CM | POA: Insufficient documentation

## 2020-03-07 DIAGNOSIS — Z87891 Personal history of nicotine dependence: Secondary | ICD-10-CM | POA: Insufficient documentation

## 2020-03-07 LAB — ETHANOL: Alcohol, Ethyl (B): 197 mg/dL — ABNORMAL HIGH (ref <10)

## 2020-03-07 LAB — CBC
HCT: 44.8 % (ref 36.0–46.0)
Hemoglobin: 14.5 g/dL (ref 12.0–15.0)
MCH: 30.3 pg (ref 26.0–34.0)
MCHC: 32.4 g/dL (ref 30.0–36.0)
MCV: 93.5 fL (ref 80.0–100.0)
Platelets: 331 10*3/uL (ref 150–400)
RBC: 4.79 MIL/uL (ref 3.87–5.11)
RDW: 11.9 % (ref 11.5–15.5)
WBC: 6.1 10*3/uL (ref 4.0–10.5)
nRBC: 0 % (ref 0.0–0.2)

## 2020-03-07 LAB — I-STAT CHEM 8, ED
BUN: 5 mg/dL — ABNORMAL LOW (ref 6–20)
Calcium, Ion: 1.09 mmol/L — ABNORMAL LOW (ref 1.15–1.40)
Chloride: 111 mmol/L (ref 98–111)
Creatinine, Ser: 1.2 mg/dL — ABNORMAL HIGH (ref 0.44–1.00)
Glucose, Bld: 87 mg/dL (ref 70–99)
HCT: 42 % (ref 36.0–46.0)
Hemoglobin: 14.3 g/dL (ref 12.0–15.0)
Potassium: 3.5 mmol/L (ref 3.5–5.1)
Sodium: 146 mmol/L — ABNORMAL HIGH (ref 135–145)
TCO2: 22 mmol/L (ref 22–32)

## 2020-03-07 LAB — I-STAT BETA HCG BLOOD, ED (MC, WL, AP ONLY): I-stat hCG, quantitative: <5 m[IU]/mL (ref <5)

## 2020-03-07 MED ORDER — NAPROXEN 500 MG PO TABS
500.0000 mg | ORAL_TABLET | Freq: Two times a day (BID) | ORAL | 0 refills | Status: DC
Start: 2020-03-07 — End: 2020-03-08
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
## 2020-03-07 NOTE — Discharge Instructions (Signed)
Your testing tonight shows no signs of significant brain injury bleeding or fracture.  Your spine looks normal, you may take medicine such as Tylenol or ibuprofen as needed for pain.  As an alternative I have prescribed naproxen that you can take twice a day as needed for pain.  Rest, apply ice packs to your scalp wrapped in a towel if you need it.  See your doctor within 2 to 3 days for recheck if still having symptoms
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
## 2020-03-07 NOTE — ED Notes (Signed)
Patient transported to CT scan . 

## 2020-03-08 MED ORDER — NAPROXEN 500 MG PO TABS
500.0000 mg | ORAL_TABLET | Freq: Two times a day (BID) | ORAL | 0 refills | Status: DC
Start: 2020-03-08 — End: 2022-09-11

## 2020-03-08 NOTE — ED Provider Notes (Signed)
Post-discharge:  Patient called requesting change of pharmacy for discharge medication. W[place] [facility] [place] [address] [place] as this is a [place].   Changes made per patient request.    Elpidio Anis, PA-C 03/08/20 0040    Eber Hong, MD 03/08/20 [phone]

## 2020-04-28 ENCOUNTER — Ambulatory Visit: Payer: Medicaid Other | Admitting: Family Medicine

## 2021-12-27 ENCOUNTER — Emergency Department (HOSPITAL_COMMUNITY): Payer: No Typology Code available for payment source

## 2021-12-27 ENCOUNTER — Encounter (HOSPITAL_COMMUNITY): Payer: Self-pay

## 2021-12-27 ENCOUNTER — Encounter (HOSPITAL_COMMUNITY): Admission: EM | Disposition: A | Discharge status: Home / Self Care | Payer: Self-pay | Source: Home / Self Care

## 2021-12-27 ENCOUNTER — Emergency Department (HOSPITAL_COMMUNITY): Payer: No Typology Code available for payment source | Admitting: Anesthesiology

## 2021-12-27 ENCOUNTER — Other Ambulatory Visit: Payer: Self-pay

## 2021-12-27 ENCOUNTER — Inpatient Hospital Stay (HOSPITAL_COMMUNITY)
Admission: EM | Admit: 2021-12-27 | Discharge: 2021-12-30 | DRG: 353 | Disposition: A | Discharge status: Home / Self Care | Payer: No Typology Code available for payment source | Attending: General Surgery | Admitting: General Surgery

## 2021-12-27 DIAGNOSIS — S31139A Puncture wound of abdominal wall without foreign body, unspecified quadrant without penetration into peritoneal cavity, initial encounter: Secondary | ICD-10-CM

## 2021-12-27 DIAGNOSIS — S31119A Laceration without foreign body of abdominal wall, unspecified quadrant without penetration into peritoneal cavity, initial encounter: Principal | ICD-10-CM | POA: Diagnosis present

## 2021-12-27 DIAGNOSIS — K625 Hemorrhage of anus and rectum: Secondary | ICD-10-CM | POA: Diagnosis not present

## 2021-12-27 DIAGNOSIS — Y907 Blood alcohol level of 200-239 mg/100 ml: Secondary | ICD-10-CM | POA: Diagnosis present

## 2021-12-27 DIAGNOSIS — G4730 Sleep apnea, unspecified: Secondary | ICD-10-CM | POA: Diagnosis present

## 2021-12-27 DIAGNOSIS — Z5900 Homelessness unspecified: Secondary | ICD-10-CM | POA: Diagnosis not present

## 2021-12-27 DIAGNOSIS — R112 Nausea with vomiting, unspecified: Secondary | ICD-10-CM | POA: Diagnosis present

## 2021-12-27 DIAGNOSIS — Z9152 Personal history of nonsuicidal self-harm: Secondary | ICD-10-CM | POA: Diagnosis not present

## 2021-12-27 DIAGNOSIS — F1090 Alcohol use, unspecified, uncomplicated: Secondary | ICD-10-CM | POA: Diagnosis present

## 2021-12-27 DIAGNOSIS — F1990 Other psychoactive substance use, unspecified, uncomplicated: Secondary | ICD-10-CM | POA: Diagnosis present

## 2021-12-27 DIAGNOSIS — F4325 Adjustment disorder with mixed disturbance of emotions and conduct: Secondary | ICD-10-CM | POA: Diagnosis not present

## 2021-12-27 DIAGNOSIS — J45909 Unspecified asthma, uncomplicated: Secondary | ICD-10-CM | POA: Diagnosis present

## 2021-12-27 DIAGNOSIS — S31119D Laceration without foreign body of abdominal wall, unspecified quadrant without penetration into peritoneal cavity, subsequent encounter: Secondary | ICD-10-CM | POA: Diagnosis not present

## 2021-12-27 DIAGNOSIS — Z6833 Body mass index (BMI) 33.0-33.9, adult: Secondary | ICD-10-CM

## 2021-12-27 DIAGNOSIS — D62 Acute posthemorrhagic anemia: Secondary | ICD-10-CM | POA: Diagnosis not present

## 2021-12-27 DIAGNOSIS — F17210 Nicotine dependence, cigarettes, uncomplicated: Secondary | ICD-10-CM | POA: Diagnosis not present

## 2021-12-27 DIAGNOSIS — F17200 Nicotine dependence, unspecified, uncomplicated: Secondary | ICD-10-CM | POA: Diagnosis present

## 2021-12-27 DIAGNOSIS — S31611A Laceration without foreign body of abdominal wall, left upper quadrant with penetration into peritoneal cavity, initial encounter: Secondary | ICD-10-CM | POA: Diagnosis present

## 2021-12-27 DIAGNOSIS — T781XXA Other adverse food reactions, not elsewhere classified, initial encounter: Secondary | ICD-10-CM | POA: Diagnosis present

## 2021-12-27 DIAGNOSIS — E669 Obesity, unspecified: Secondary | ICD-10-CM | POA: Diagnosis present

## 2021-12-27 DIAGNOSIS — Z23 Encounter for immunization: Secondary | ICD-10-CM | POA: Diagnosis not present

## 2021-12-27 DIAGNOSIS — W260XXA Contact with knife, initial encounter: Secondary | ICD-10-CM | POA: Diagnosis present

## 2021-12-27 DIAGNOSIS — K661 Hemoperitoneum: Secondary | ICD-10-CM | POA: Diagnosis present

## 2021-12-27 DIAGNOSIS — Y92009 Unspecified place in unspecified non-institutional (private) residence as the place of occurrence of the external cause: Secondary | ICD-10-CM | POA: Diagnosis not present

## 2021-12-27 DIAGNOSIS — R4587 Impulsiveness: Secondary | ICD-10-CM | POA: Diagnosis present

## 2021-12-27 HISTORY — DX: Anemia, unspecified: D64.9

## 2021-12-27 HISTORY — DX: Sleep apnea, unspecified: G47.30

## 2021-12-27 HISTORY — PX: LAPAROTOMY: SHX154

## 2021-12-27 LAB — COMPREHENSIVE METABOLIC PANEL
ALT: 19 U/L (ref 0–44)
AST: 26 U/L (ref 15–41)
Albumin: 3.7 g/dL (ref 3.5–5.0)
Alkaline Phosphatase: 76 U/L (ref 38–126)
Anion gap: 14 (ref 5–15)
BUN: 9 mg/dL (ref 6–20)
CO2: 23 mmol/L (ref 22–32)
Calcium: 9.2 mg/dL (ref 8.9–10.3)
Chloride: 103 mmol/L (ref 98–111)
Creatinine, Ser: 0.96 mg/dL (ref 0.44–1.00)
GFR, Estimated: >60 mL/min (ref >60)
Glucose, Bld: 121 mg/dL — ABNORMAL HIGH (ref 70–99)
Potassium: 3.8 mmol/L (ref 3.5–5.1)
Sodium: 140 mmol/L (ref 135–145)
Total Bilirubin: 0.5 mg/dL (ref 0.3–1.2)
Total Protein: 7.2 g/dL (ref 6.5–8.1)

## 2021-12-27 LAB — CBC WITH DIFFERENTIAL/PLATELET
Abs Immature Granulocytes: 0.02 10*3/uL (ref 0.00–0.07)
Basophils Absolute: 0.1 10*3/uL (ref 0.0–0.1)
Basophils Relative: 1 %
Eosinophils Absolute: 0.5 10*3/uL (ref 0.0–0.5)
Eosinophils Relative: 6 %
HCT: 40.3 % (ref 36.0–46.0)
Hemoglobin: 13.7 g/dL (ref 12.0–15.0)
Immature Granulocytes: 0 %
Lymphocytes Relative: 44 %
Lymphs Abs: 3.7 10*3/uL (ref 0.7–4.0)
MCH: 31.6 pg (ref 26.0–34.0)
MCHC: 34 g/dL (ref 30.0–36.0)
MCV: 92.9 fL (ref 80.0–100.0)
Monocytes Absolute: 0.6 10*3/uL (ref 0.1–1.0)
Monocytes Relative: 7 %
Neutro Abs: 3.6 10*3/uL (ref 1.7–7.7)
Neutrophils Relative %: 42 %
Platelets: 340 10*3/uL (ref 150–400)
RBC: 4.34 MIL/uL (ref 3.87–5.11)
RDW: 11.8 % (ref 11.5–15.5)
WBC: 8.6 10*3/uL (ref 4.0–10.5)
nRBC: 0 % (ref 0.0–0.2)

## 2021-12-27 LAB — I-STAT BETA HCG BLOOD, ED (MC, WL, AP ONLY): I-stat hCG, quantitative: <5 m[IU]/mL (ref <5)

## 2021-12-27 LAB — RAPID URINE DRUG SCREEN, HOSP PERFORMED
Amphetamines: POSITIVE — AB
Barbiturates: NOT DETECTED
Benzodiazepines: NOT DETECTED
Cocaine: NOT DETECTED
Opiates: NOT DETECTED
Tetrahydrocannabinol: POSITIVE — AB

## 2021-12-27 LAB — I-STAT CHEM 8, ED
BUN: 9 mg/dL (ref 6–20)
Calcium, Ion: 1.06 mmol/L — ABNORMAL LOW (ref 1.15–1.40)
Chloride: 104 mmol/L (ref 98–111)
Creatinine, Ser: 1.2 mg/dL — ABNORMAL HIGH (ref 0.44–1.00)
Glucose, Bld: 118 mg/dL — ABNORMAL HIGH (ref 70–99)
HCT: 42 % (ref 36.0–46.0)
Hemoglobin: 14.3 g/dL (ref 12.0–15.0)
Potassium: 3.8 mmol/L (ref 3.5–5.1)
Sodium: 141 mmol/L (ref 135–145)
TCO2: 24 mmol/L (ref 22–32)

## 2021-12-27 LAB — ETHANOL: Alcohol, Ethyl (B): 234 mg/dL — ABNORMAL HIGH (ref <10)

## 2021-12-27 LAB — LIPASE, BLOOD: Lipase: 36 U/L (ref 11–51)

## 2021-12-27 LAB — ACETAMINOPHEN LEVEL: Acetaminophen (Tylenol), Serum: <10 ug/mL — ABNORMAL LOW (ref 10–30)

## 2021-12-27 LAB — SALICYLATE LEVEL: Salicylate Lvl: <7 mg/dL — ABNORMAL LOW (ref 7.0–30.0)

## 2021-12-27 SURGERY — LAPAROTOMY, EXPLORATORY
Anesthesia: General

## 2021-12-27 MED ORDER — FENTANYL CITRATE PF 50 MCG/ML IJ SOSY
50.0000 ug | PREFILLED_SYRINGE | Freq: Once | INTRAMUSCULAR | Status: AC
  Administered 2021-12-27: 50 ug via INTRAVENOUS
  Filled 2021-12-27: qty 1

## 2021-12-27 MED ORDER — FENTANYL CITRATE PF 50 MCG/ML IJ SOSY
PREFILLED_SYRINGE | INTRAMUSCULAR | Status: AC
  Administered 2021-12-27: 50 ug
  Filled 2021-12-27: qty 1

## 2021-12-27 MED ORDER — PROPOFOL 10 MG/ML IV BOLUS
INTRAVENOUS | Status: AC
  Filled 2021-12-27: qty 20

## 2021-12-27 MED ORDER — CEFAZOLIN SODIUM-DEXTROSE 2-4 GM/100ML-% IV SOLN
2.0000 g | Freq: Once | INTRAVENOUS | Status: AC
  Administered 2021-12-27: 2 g via INTRAVENOUS
  Filled 2021-12-27: qty 100

## 2021-12-27 MED ORDER — ONDANSETRON HCL 4 MG/2ML IJ SOLN
4.0000 mg | Freq: Once | INTRAMUSCULAR | Status: AC
  Administered 2021-12-27: 4 mg via INTRAVENOUS

## 2021-12-27 MED ORDER — SUGAMMADEX SODIUM 200 MG/2ML IV SOLN
INTRAVENOUS | Status: DC | PRN
  Administered 2021-12-27: 200 mg via INTRAVENOUS

## 2021-12-27 MED ORDER — FENTANYL CITRATE (PF) 100 MCG/2ML IJ SOLN
50.0000 ug | Freq: Once | INTRAMUSCULAR | Status: AC
  Administered 2021-12-27: 50 ug via INTRAVENOUS

## 2021-12-27 MED ORDER — DEXAMETHASONE SODIUM PHOSPHATE 4 MG/ML IJ SOLN
INTRAMUSCULAR | Status: DC | PRN
  Administered 2021-12-27: 10 mg via INTRAVENOUS

## 2021-12-27 MED ORDER — ONDANSETRON HCL 4 MG/2ML IJ SOLN
INTRAMUSCULAR | Status: DC | PRN
  Administered 2021-12-27: 4 mg via INTRAVENOUS

## 2021-12-27 MED ORDER — ACETAMINOPHEN 10 MG/ML IV SOLN
INTRAVENOUS | Status: DC | PRN
  Administered 2021-12-27: 1000 mg via INTRAVENOUS

## 2021-12-27 MED ORDER — FENTANYL CITRATE (PF) 100 MCG/2ML IJ SOLN
25.0000 ug | INTRAMUSCULAR | Status: DC | PRN
  Administered 2021-12-28 (×3): 50 ug via INTRAVENOUS

## 2021-12-27 MED ORDER — PROMETHAZINE HCL 25 MG/ML IJ SOLN
6.2500 mg | INTRAMUSCULAR | Status: DC | PRN
  Administered 2021-12-28: 12.5 mg via INTRAVENOUS

## 2021-12-27 MED ORDER — 0.9 % SODIUM CHLORIDE (POUR BTL) OPTIME
TOPICAL | Status: DC | PRN
  Administered 2021-12-27 (×3): 1000 mL

## 2021-12-27 MED ORDER — TETANUS-DIPHTH-ACELL PERTUSSIS 5-2.5-18.5 LF-MCG/0.5 IM SUSY
0.5000 mL | PREFILLED_SYRINGE | Freq: Once | INTRAMUSCULAR | Status: AC
  Administered 2021-12-27: 0.5 mL via INTRAMUSCULAR

## 2021-12-27 MED ORDER — IOHEXOL 350 MG/ML SOLN
75.0000 mL | Freq: Once | INTRAVENOUS | Status: AC | PRN
  Administered 2021-12-27: 75 mL via INTRAVENOUS

## 2021-12-27 MED ORDER — PHENYLEPHRINE HCL-NACL 20-0.9 MG/250ML-% IV SOLN
INTRAVENOUS | Status: DC | PRN
  Administered 2021-12-27: 50 ug/min via INTRAVENOUS

## 2021-12-27 MED ORDER — ROCURONIUM BROMIDE 100 MG/10ML IV SOLN
INTRAVENOUS | Status: DC | PRN
  Administered 2021-12-27: 50 mg via INTRAVENOUS

## 2021-12-27 MED ORDER — FENTANYL CITRATE (PF) 250 MCG/5ML IJ SOLN
INTRAMUSCULAR | Status: AC
  Filled 2021-12-27: qty 5

## 2021-12-27 MED ORDER — FENTANYL CITRATE (PF) 100 MCG/2ML IJ SOLN
INTRAMUSCULAR | Status: DC | PRN
  Administered 2021-12-27: 100 ug via INTRAVENOUS
  Administered 2021-12-27: 50 ug via INTRAVENOUS

## 2021-12-27 MED ORDER — LIDOCAINE HCL (CARDIAC) PF 50 MG/5ML IV SOSY
PREFILLED_SYRINGE | INTRAVENOUS | Status: DC | PRN
  Administered 2021-12-27: 80 mg via INTRAVENOUS

## 2021-12-27 MED ORDER — MIDAZOLAM HCL 5 MG/5ML IJ SOLN
INTRAMUSCULAR | Status: DC | PRN
  Administered 2021-12-27: 2 mg via INTRAVENOUS

## 2021-12-27 MED ORDER — ACETAMINOPHEN 10 MG/ML IV SOLN
INTRAVENOUS | Status: AC
  Filled 2021-12-27: qty 100

## 2021-12-27 MED ORDER — FENTANYL CITRATE (PF) 100 MCG/2ML IJ SOLN
INTRAMUSCULAR | Status: AC
  Filled 2021-12-27: qty 2

## 2021-12-27 MED ORDER — PROPOFOL 10 MG/ML IV BOLUS
INTRAVENOUS | Status: DC | PRN
  Administered 2021-12-27: 150 mg via INTRAVENOUS

## 2021-12-27 MED ORDER — MIDAZOLAM HCL 2 MG/2ML IJ SOLN
INTRAMUSCULAR | Status: AC
  Filled 2021-12-27: qty 2

## 2021-12-27 MED ORDER — FENTANYL CITRATE PF 50 MCG/ML IJ SOSY: 50.0000 ug | PREFILLED_SYRINGE | Freq: Once | INTRAMUSCULAR | Status: AC

## 2021-12-27 MED ORDER — LACTATED RINGERS IV SOLN: INTRAVENOUS | Status: DC | PRN

## 2021-12-27 MED ORDER — SUCCINYLCHOLINE CHLORIDE 20 MG/ML IJ SOLN
INTRAMUSCULAR | Status: DC | PRN
  Administered 2021-12-27: 100 mg via INTRAVENOUS

## 2021-12-27 SURGICAL SUPPLY — 42 items
BAG COUNTER SPONGE SURGICOUNT (BAG) ×1 IMPLANT
BLADE CLIPPER SURG (BLADE) IMPLANT
CANISTER SUCT 3000ML PPV (MISCELLANEOUS) ×1 IMPLANT
CHLORAPREP W/TINT 26 (MISCELLANEOUS) ×1 IMPLANT
COVER SURGICAL LIGHT HANDLE (MISCELLANEOUS) ×1 IMPLANT
DRAPE LAPAROSCOPIC ABDOMINAL (DRAPES) ×1 IMPLANT
DRAPE WARM FLUID 44X44 (DRAPES) ×1 IMPLANT
DRSG COVADERM PLUS 2X2 (GAUZE/BANDAGES/DRESSINGS) IMPLANT
DRSG OPSITE POSTOP 4X10 (GAUZE/BANDAGES/DRESSINGS) IMPLANT
DRSG OPSITE POSTOP 4X8 (GAUZE/BANDAGES/DRESSINGS) IMPLANT
ELECT BLADE 6.5 EXT (BLADE) IMPLANT
ELECT CAUTERY BLADE 6.4 (BLADE) ×1 IMPLANT
ELECT REM PT RETURN 9FT ADLT (ELECTROSURGICAL) ×1
ELECTRODE REM PT RTRN 9FT ADLT (ELECTROSURGICAL) ×1 IMPLANT
GLOVE BIO SURGEON STRL SZ8 (GLOVE) ×1 IMPLANT
GLOVE BIOGEL PI IND STRL 8 (GLOVE) ×1 IMPLANT
GOWN STRL REUS W/ TWL LRG LVL3 (GOWN DISPOSABLE) ×1 IMPLANT
GOWN STRL REUS W/ TWL XL LVL3 (GOWN DISPOSABLE) ×1 IMPLANT
GOWN STRL REUS W/TWL LRG LVL3 (GOWN DISPOSABLE) ×1
GOWN STRL REUS W/TWL XL LVL3 (GOWN DISPOSABLE) ×1
HANDLE SUCTION POOLE (INSTRUMENTS) ×1 IMPLANT
HIBICLENS CHG 4% 4OZ BTL (MISCELLANEOUS) IMPLANT
KIT BASIN OR (CUSTOM PROCEDURE TRAY) ×1 IMPLANT
KIT TURNOVER KIT B (KITS) ×1 IMPLANT
LIGASURE IMPACT 36 18CM CVD LR (INSTRUMENTS) IMPLANT
NS IRRIG 1000ML POUR BTL (IV SOLUTION) ×2 IMPLANT
PACK GENERAL/GYN (CUSTOM PROCEDURE TRAY) ×1 IMPLANT
PAD ARMBOARD 7.5X6 YLW CONV (MISCELLANEOUS) ×1 IMPLANT
PENCIL SMOKE EVACUATOR (MISCELLANEOUS) ×1 IMPLANT
SPECIMEN JAR LARGE (MISCELLANEOUS) IMPLANT
SPONGE T-LAP 18X18 ~~LOC~~+RFID (SPONGE) IMPLANT
STAPLER VISISTAT 35W (STAPLE) ×1 IMPLANT
SUCTION POOLE HANDLE (INSTRUMENTS) ×1
SUT PDS AB 1 TP1 96 (SUTURE) ×2 IMPLANT
SUT PDS II 0 TP-1 LOOPED 60 (SUTURE) IMPLANT
SUT SILK 2 0 SH CR/8 (SUTURE) ×1 IMPLANT
SUT SILK 2 0 TIES 10X30 (SUTURE) ×1 IMPLANT
SUT SILK 3 0 SH CR/8 (SUTURE) ×1 IMPLANT
SUT SILK 3 0 TIES 10X30 (SUTURE) ×1 IMPLANT
TOWEL GREEN STERILE (TOWEL DISPOSABLE) ×1 IMPLANT
TRAY FOLEY MTR SLVR 16FR STAT (SET/KITS/TRAYS/PACK) IMPLANT
YANKAUER SUCT BULB TIP NO VENT (SUCTIONS) IMPLANT
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
## 2021-12-27 NOTE — Anesthesia Procedure Notes (Signed)
Procedure Name: Intubation Date/Time: 12/27/2021 10:54 PM  Performed by: Suzy Bouchard, CRNAPre-anesthesia Checklist: Patient identified, Emergency Drugs available, Patient being monitored and Timeout performed Patient Re-evaluated:Patient Re-evaluated prior to induction Oxygen Delivery Method: Circle system utilized Preoxygenation: Pre-oxygenation with 100% oxygen Induction Type: IV induction, Rapid sequence and Cricoid Pressure applied Laryngoscope Size: Miller and 2 Grade View: Grade III Tube type: Oral Tube size: 7.0 mm Number of attempts: 1 Airway Equipment and Method: Stylet Placement Confirmation: ETT inserted through vocal cords under direct vision, positive ETCO2 and breath sounds checked- equal and bilateral Secured at: 22 cm Tube secured with: Tape Dental Injury: Teeth and Oropharynx as per pre-operative assessment
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
## 2021-12-27 NOTE — ED Notes (Signed)
Pt returned from CT °
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
## 2021-12-27 NOTE — ED Notes (Signed)
Pt's family to bedside. Explained to family that pt has received pain medication. Trauma MD to bedside to describe OR procedure.
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
## 2021-12-27 NOTE — ED Notes (Signed)
Pt to OR w/trauma RN. Pt A&Ox4, c/o abdominal pain. Mother at bedside with pt.
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
## 2021-12-28 ENCOUNTER — Encounter (HOSPITAL_COMMUNITY): Payer: Self-pay | Admitting: General Surgery

## 2021-12-28 DIAGNOSIS — F4325 Adjustment disorder with mixed disturbance of emotions and conduct: Secondary | ICD-10-CM

## 2021-12-28 DIAGNOSIS — Z9152 Personal history of nonsuicidal self-harm: Secondary | ICD-10-CM

## 2021-12-28 LAB — CBC
HCT: 36 % (ref 36.0–46.0)
Hemoglobin: 12.3 g/dL (ref 12.0–15.0)
MCH: 31.6 pg (ref 26.0–34.0)
MCHC: 34.2 g/dL (ref 30.0–36.0)
MCV: 92.5 fL (ref 80.0–100.0)
Platelets: 265 10*3/uL (ref 150–400)
RBC: 3.89 MIL/uL (ref 3.87–5.11)
RDW: 11.9 % (ref 11.5–15.5)
WBC: 13.5 10*3/uL — ABNORMAL HIGH (ref 4.0–10.5)
nRBC: 0 % (ref 0.0–0.2)

## 2021-12-28 LAB — BASIC METABOLIC PANEL
Anion gap: 8 (ref 5–15)
BUN: 5 mg/dL — ABNORMAL LOW (ref 6–20)
CO2: 23 mmol/L (ref 22–32)
Calcium: 8.5 mg/dL — ABNORMAL LOW (ref 8.9–10.3)
Chloride: 104 mmol/L (ref 98–111)
Creatinine, Ser: 0.72 mg/dL (ref 0.44–1.00)
GFR, Estimated: >60 mL/min (ref >60)
Glucose, Bld: 134 mg/dL — ABNORMAL HIGH (ref 70–99)
Potassium: 3.9 mmol/L (ref 3.5–5.1)
Sodium: 135 mmol/L (ref 135–145)

## 2021-12-28 LAB — HIV ANTIBODY (ROUTINE TESTING W REFLEX): HIV Screen 4th Generation wRfx: NONREACTIVE

## 2021-12-28 MED ORDER — METHOCARBAMOL 1000 MG/10ML IJ SOLN
1000.0000 mg | Freq: Three times a day (TID) | INTRAVENOUS | Status: DC
  Administered 2021-12-28: 1000 mg via INTRAVENOUS
  Filled 2021-12-28: qty 1000
  Filled 2021-12-28 (×2): qty 10

## 2021-12-28 MED ORDER — FENTANYL CITRATE (PF) 100 MCG/2ML IJ SOLN
INTRAMUSCULAR | Status: AC
  Filled 2021-12-28: qty 2

## 2021-12-28 MED ORDER — ONDANSETRON 4 MG PO TBDP: 4.0000 mg | ORAL_TABLET | Freq: Four times a day (QID) | ORAL | Status: DC | PRN

## 2021-12-28 MED ORDER — HYDROMORPHONE HCL 1 MG/ML IJ SOLN
0.5000 mg | INTRAMUSCULAR | Status: DC | PRN
  Administered 2021-12-28 – 2021-12-30 (×7): 1 mg via INTRAVENOUS
  Filled 2021-12-28 (×6): qty 1

## 2021-12-28 MED ORDER — OXYCODONE HCL 5 MG PO TABS: 5.0000 mg | ORAL_TABLET | ORAL | Status: DC | PRN

## 2021-12-28 MED ORDER — ENOXAPARIN SODIUM 30 MG/0.3ML IJ SOSY
30.0000 mg | PREFILLED_SYRINGE | Freq: Two times a day (BID) | INTRAMUSCULAR | Status: DC
  Administered 2021-12-29 – 2021-12-30 (×3): 30 mg via SUBCUTANEOUS
  Filled 2021-12-28 (×3): qty 0.3

## 2021-12-28 MED ORDER — ONDANSETRON HCL 4 MG/2ML IJ SOLN: 4.0000 mg | Freq: Four times a day (QID) | INTRAMUSCULAR | Status: DC | PRN

## 2021-12-28 MED ORDER — OXYCODONE HCL 5 MG PO TABS
10.0000 mg | ORAL_TABLET | ORAL | Status: DC | PRN
  Administered 2021-12-28 (×2): 10 mg via ORAL
  Filled 2021-12-28 (×2): qty 2

## 2021-12-28 MED ORDER — ACETAMINOPHEN 325 MG PO TABS
650.0000 mg | ORAL_TABLET | Freq: Four times a day (QID) | ORAL | Status: DC
  Administered 2021-12-28: 650 mg via ORAL
  Filled 2021-12-28 (×2): qty 2

## 2021-12-28 MED ORDER — LACTATED RINGERS IV SOLN: INTRAVENOUS | Status: DC

## 2021-12-28 MED ORDER — CEFAZOLIN SODIUM-DEXTROSE 1-4 GM/50ML-% IV SOLN
1.0000 g | Freq: Three times a day (TID) | INTRAVENOUS | Status: DC
  Administered 2021-12-28: 1 g via INTRAVENOUS
  Filled 2021-12-28 (×2): qty 50

## 2021-12-28 MED ORDER — LIDOCAINE 5 % EX PTCH
1.0000 | MEDICATED_PATCH | CUTANEOUS | Status: DC
  Administered 2021-12-28 – 2021-12-30 (×3): 1 via TRANSDERMAL
  Filled 2021-12-28 (×3): qty 1

## 2021-12-28 MED ORDER — HYDROMORPHONE HCL 1 MG/ML IJ SOLN
INTRAMUSCULAR | Status: AC
  Filled 2021-12-28: qty 1

## 2021-12-28 MED ORDER — OXYCODONE HCL 5 MG PO TABS
10.0000 mg | ORAL_TABLET | ORAL | Status: DC | PRN
  Administered 2021-12-28 – 2021-12-30 (×7): 10 mg via ORAL
  Administered 2021-12-30: 15 mg via ORAL
  Filled 2021-12-28 (×3): qty 2
  Filled 2021-12-28: qty 3
  Filled 2021-12-28 (×4): qty 2

## 2021-12-28 MED ORDER — PROMETHAZINE HCL 25 MG/ML IJ SOLN
INTRAMUSCULAR | Status: AC
  Filled 2021-12-28: qty 1

## 2021-12-28 MED ORDER — METHOCARBAMOL 1000 MG/10ML IJ SOLN
1000.0000 mg | Freq: Four times a day (QID) | INTRAVENOUS | Status: DC
  Administered 2021-12-28 – 2021-12-29 (×4): 1000 mg via INTRAVENOUS
  Filled 2021-12-28: qty 1000
  Filled 2021-12-28: qty 10
  Filled 2021-12-28: qty 1000
  Filled 2021-12-28: qty 10
  Filled 2021-12-28: qty 1000
  Filled 2021-12-28 (×2): qty 10

## 2021-12-28 MED ORDER — ACETAMINOPHEN 500 MG PO TABS
1000.0000 mg | ORAL_TABLET | Freq: Four times a day (QID) | ORAL | Status: DC
  Administered 2021-12-28 – 2021-12-30 (×6): 1000 mg via ORAL
  Filled 2021-12-28 (×7): qty 2
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
## 2021-12-28 NOTE — Progress Notes (Addendum)
Pt arrived to 6n12 from PACU.  Boyfriend at bedside. Constantly asking for pain meds.  [facility] dsg with sml amt of sero sang drainage. Oxycodone 10mg  given at this time. Orieneted to room and surrounding. Plan of care in place.
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
## 2021-12-28 NOTE — Progress Notes (Signed)
Mobility Specialist - Progress Note   12/28/21 1244  Mobility  Activity Ambulated with assistance in hallway  Level of Assistance Standby assist, set-up cues, supervision of patient - no hands on  Assistive Device Front wheel walker  Distance Ambulated (ft) 300 ft  Activity Response Tolerated well  $Mobility charge 1 Mobility    Pt received in bed agreeable to mobility. Left in bed w/ call bell in reach and all needs met.   Paulla Dolly Mobility Specialist

## 2021-12-28 NOTE — Transfer of Care (Signed)
Immediate Anesthesia Transfer of Care Note  Patient: Julieanne Hadsall  Procedure(s) Performed: EXPLORATORY LAPAROTOMY, Repair of Omentum, Repair of Rectus Muscle  Patient Location: PACU awake, alert , and oriented Anesthesia Type:General  Level of Consciousness: awake and alert   Airway & Oxygen Therapy: Patient Spontanous Breathing and Patient connected to nasal cannula oxygen  Post-op Assessment: Report given to RN and Post -op Vital signs reviewed and stable  Post vital signs: Reviewed and stable  Last Vitals:  Vitals Value Taken Time  BP 152/118 12/28/21 0010  Temp 37.2 C 12/28/21 0010  Pulse 85 12/28/21 0013  Resp 10 12/28/21 0013  SpO2 94 % 12/28/21 0013  Vitals shown include unvalidated device data.  Last Pain:  Vitals:   12/27/21 2145  TempSrc:   PainSc: 10-Worst pain ever         Complications: No notable events documented.

## 2021-12-28 NOTE — Anesthesia Postprocedure Evaluation (Signed)
Anesthesia Post Note  Patient: Erica Harrell  Procedure(s) Performed: EXPLORATORY LAPAROTOMY, Repair of Omentum, Repair of Rectus Muscle     Patient location during evaluation: PACU Anesthesia Type: General Level of consciousness: awake and alert Pain management: pain level controlled Vital Signs Assessment: post-procedure vital signs reviewed and stable Respiratory status: spontaneous breathing, nonlabored ventilation, respiratory function stable and patient connected to nasal cannula oxygen Cardiovascular status: blood pressure returned to baseline and stable Postop Assessment: no apparent nausea or vomiting Anesthetic complications: no   No notable events documented.  Last Vitals:  Vitals:   12/28/21 0030 12/28/21 0045  BP: (!) 147/88 (!) 132/104  Pulse: (!) 101 82  Resp: 12 10  Temp:    SpO2: 96% 95%    Last Pain:  Vitals:   12/28/21 0045  TempSrc:   PainSc: [place] Lamaya Hyneman

## 2021-12-29 DIAGNOSIS — S31119D Laceration without foreign body of abdominal wall, unspecified quadrant without penetration into peritoneal cavity, subsequent encounter: Secondary | ICD-10-CM

## 2021-12-29 DIAGNOSIS — F4325 Adjustment disorder with mixed disturbance of emotions and conduct: Secondary | ICD-10-CM

## 2021-12-29 DIAGNOSIS — Z9152 Personal history of nonsuicidal self-harm: Secondary | ICD-10-CM

## 2021-12-29 LAB — CBC
HCT: 30.6 % — ABNORMAL LOW (ref 36.0–46.0)
Hemoglobin: 10.3 g/dL — ABNORMAL LOW (ref 12.0–15.0)
MCH: 31.4 pg (ref 26.0–34.0)
MCHC: 33.7 g/dL (ref 30.0–36.0)
MCV: 93.3 fL (ref 80.0–100.0)
Platelets: 229 10*3/uL (ref 150–400)
RBC: 3.28 MIL/uL — ABNORMAL LOW (ref 3.87–5.11)
RDW: 12 % (ref 11.5–15.5)
WBC: 8.7 10*3/uL (ref 4.0–10.5)
nRBC: 0 % (ref 0.0–0.2)

## 2021-12-29 LAB — BASIC METABOLIC PANEL
Anion gap: 8 (ref 5–15)
BUN: <5 mg/dL — ABNORMAL LOW (ref 6–20)
CO2: 25 mmol/L (ref 22–32)
Calcium: 8.6 mg/dL — ABNORMAL LOW (ref 8.9–10.3)
Chloride: 102 mmol/L (ref 98–111)
Creatinine, Ser: 0.7 mg/dL (ref 0.44–1.00)
GFR, Estimated: >60 mL/min (ref >60)
Glucose, Bld: 121 mg/dL — ABNORMAL HIGH (ref 70–99)
Potassium: 3.2 mmol/L — ABNORMAL LOW (ref 3.5–5.1)
Sodium: 135 mmol/L (ref 135–145)

## 2021-12-29 LAB — MAGNESIUM: Magnesium: 1.7 mg/dL (ref 1.7–2.4)

## 2021-12-29 MED ORDER — MAGNESIUM OXIDE -MG SUPPLEMENT 400 (240 MG) MG PO TABS
800.0000 mg | ORAL_TABLET | Freq: Once | ORAL | Status: AC
  Administered 2021-12-29: 800 mg via ORAL
  Filled 2021-12-29: qty 2

## 2021-12-29 MED ORDER — POTASSIUM CHLORIDE 20 MEQ PO PACK
40.0000 meq | PACK | Freq: Two times a day (BID) | ORAL | Status: AC
  Administered 2021-12-29 (×2): 40 meq via ORAL
  Filled 2021-12-29 (×2): qty 2

## 2021-12-29 MED ORDER — HYDRALAZINE HCL 20 MG/ML IJ SOLN: 5.0000 mg | Freq: Four times a day (QID) | INTRAMUSCULAR | Status: DC | PRN

## 2021-12-29 MED ORDER — METHOCARBAMOL 500 MG PO TABS
1000.0000 mg | ORAL_TABLET | Freq: Four times a day (QID) | ORAL | Status: DC
  Administered 2021-12-29 – 2021-12-30 (×4): 1000 mg via ORAL
  Filled 2021-12-29 (×4): qty 2

## 2021-12-29 MED ORDER — SIMETHICONE 80 MG PO CHEW
80.0000 mg | CHEWABLE_TABLET | Freq: Four times a day (QID) | ORAL | Status: DC | PRN
  Administered 2021-12-29: 80 mg via ORAL
  Filled 2021-12-29: qty 1

## 2021-12-29 NOTE — Progress Notes (Signed)
Mobility Specialist - Progress Note   12/29/21 1140  Mobility  Activity Ambulated with assistance in hallway  Level of Assistance Standby assist, set-up cues, supervision of patient - no hands on  Assistive Device None  Distance Ambulated (ft) 300 ft  Activity Response Tolerated well  $Mobility charge 1 Mobility    Pt received in room agreeable to mobility. No complaints throughout. Left in recliner w/ call bell in reach and all needs met.   Paulla Dolly Mobility Specialist
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
## 2021-12-29 NOTE — Discharge Instructions (Addendum)
Please come to [facility] during walk in hours to establish care for therapy:   Address:  [address] in [place], [address] Ph: [phone]   New patient therapy walk in hours: Mondays and Wednesdays from 8 AM to 11 AM Please show up by 7:00 AM to ensure you are seen     [facility], [place] [phone]  OPEN ABDOMINAL SURGERY: POST OP INSTRUCTIONS  Always review your discharge instruction sheet given to you by the facility where your surgery was performed.  IF YOU HAVE DISABILITY OR FAMILY LEAVE FORMS, YOU MUST BRING THEM TO THE OFFICE FOR PROCESSING.  PLEASE DO NOT GIVE THEM TO YOUR DOCTOR.  A prescription for pain medication may be given to you upon discharge.  Take your pain medication as prescribed, if needed.  If narcotic pain medicine is not needed, then you may take acetaminophen (Tylenol) or ibuprofen (Advil) as needed. Take your usually prescribed medications unless otherwise directed. If you need a refill on your pain medication, please contact your pharmacy. They will contact our office to request authorization.  Prescriptions will not be filled after 5pm or on week-ends. You should follow a light diet the first few days after arrival home, such as soup and crackers, pudding, etc.unless your doctor has advised otherwise. A high-fiber, low fat diet can be resumed as tolerated.   Be sure to include lots of fluids daily. Most patients will experience some swelling and bruising on the chest and neck area.  Ice packs will help.  Swelling and bruising can take several days to resolve Most patients will experience some swelling and bruising in the area of the incision. Ice pack will help. Swelling and bruising can take several days to resolve..  It is common to experience some constipation if taking pain medication after surgery.  Increasing fluid intake and taking a stool softener will usually help or prevent this problem from  occurring.  A mild laxative (Milk of Magnesia or Miralax) should be taken according to package directions if there are no bowel movements after 48 hours.  You may have steri-strips (small skin tapes) in place directly over the incision.  These strips should be left on the skin for 7-10 days.  If your surgeon used skin glue on the incision, you may shower in 24 hours.  The glue will flake off over the next 2-3 weeks.  Any sutures or staples will be removed at the office during your follow-up visit. You may find that a light gauze bandage over your incision may keep your staples from being rubbed or pulled. You may shower and replace the bandage daily. ACTIVITIES:  You may resume regular (light) daily activities beginning the next day--such as daily self-care, walking, climbing stairs--gradually increasing activities as tolerated.  You may have sexual intercourse when it is comfortable.  Refrain from any heavy lifting or straining until approved by your doctor. You may drive when you no longer are taking prescription pain medication, you can comfortably wear a seatbelt, and you can safely maneuver your car and apply brakes  You should see your doctor in the office for a follow-up appointment approximately two weeks after your surgery.  Make sure that you call for this appointment within a day or two after you arrive home to insure a convenient appointment time.  WHEN TO CALL YOUR DOCTOR: Fever over 101.0 Inability to urinate Nausea and/or vomiting Extreme swelling or bruising Continued bleeding from  incision. Increased pain, redness, or drainage from the incision. Difficulty swallowing or breathing Muscle cramping or spasms. Numbness or tingling in hands or feet or around lips.  The clinic staff is available to answer your questions during regular business hours.  Please don't hesitate to call and ask to speak to one of the nurses if you have concerns.  For further questions, please visit  [email or web address]

## 2021-12-30 ENCOUNTER — Other Ambulatory Visit (HOSPITAL_COMMUNITY): Payer: Self-pay

## 2021-12-30 ENCOUNTER — Encounter (HOSPITAL_COMMUNITY): Payer: Self-pay | Admitting: Emergency Medicine

## 2021-12-30 LAB — BASIC METABOLIC PANEL
Anion gap: 8 (ref 5–15)
BUN: 6 mg/dL (ref 6–20)
CO2: 25 mmol/L (ref 22–32)
Calcium: 8.8 mg/dL — ABNORMAL LOW (ref 8.9–10.3)
Chloride: 105 mmol/L (ref 98–111)
Creatinine, Ser: 0.81 mg/dL (ref 0.44–1.00)
GFR, Estimated: >60 mL/min (ref >60)
Glucose, Bld: 101 mg/dL — ABNORMAL HIGH (ref 70–99)
Potassium: 3.9 mmol/L (ref 3.5–5.1)
Sodium: 138 mmol/L (ref 135–145)

## 2021-12-30 LAB — CBC
HCT: 33.4 % — ABNORMAL LOW (ref 36.0–46.0)
Hemoglobin: 11.1 g/dL — ABNORMAL LOW (ref 12.0–15.0)
MCH: 31.3 pg (ref 26.0–34.0)
MCHC: 33.2 g/dL (ref 30.0–36.0)
MCV: 94.1 fL (ref 80.0–100.0)
Platelets: 228 10*3/uL (ref 150–400)
RBC: 3.55 MIL/uL — ABNORMAL LOW (ref 3.87–5.11)
RDW: 11.9 % (ref 11.5–15.5)
WBC: 5.8 10*3/uL (ref 4.0–10.5)
nRBC: 0 % (ref 0.0–0.2)

## 2021-12-30 MED ORDER — METHOCARBAMOL 500 MG PO TABS
500.0000 mg | ORAL_TABLET | Freq: Three times a day (TID) | ORAL | 0 refills | Status: DC | PRN
  Filled 2021-12-30: qty 60, 10d supply, fill #0

## 2021-12-30 MED ORDER — ACETAMINOPHEN 500 MG PO TABS: 1000.0000 mg | ORAL_TABLET | Freq: Four times a day (QID) | ORAL | 0 refills | Status: DC | PRN

## 2021-12-30 MED ORDER — OXYCODONE HCL 10 MG PO TABS
10.0000 mg | ORAL_TABLET | ORAL | 0 refills | Status: DC | PRN
  Filled 2021-12-30: qty 20, 4d supply, fill #0
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
## 2021-12-30 NOTE — Progress Notes (Signed)
Mobility Specialist - Progress Note   12/30/21 0921  Mobility  Activity Ambulated with assistance in hallway  Level of Assistance Standby assist, set-up cues, supervision of patient - no hands on  Assistive Device None  Distance Ambulated (ft) 400 ft  Activity Response Tolerated well  $Mobility charge 1 Mobility    Pt received in bed agreeable to mobility. No complaints throughout. Left sitting EOB w/ call bell in reach and all needs met.   [place] Specialist Please contact via SecureChat or Rehab office at [phone]
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
## 2021-12-30 NOTE — Progress Notes (Signed)
Ala Dach to be D/C'd  per MD order.  Discussed with the patient and all questions fully answered.  VSS, Skin clean, dry and intact without evidence of skin break down, no evidence of skin tears noted.  IV catheter discontinued intact. Site without signs and symptoms of complications. Dressing and pressure applied.  An After Visit Summary was printed and given to the patient. Patient received prescription from [facility] pharmacy.  D/c education completed with patient/family including follow up instructions, medication list, d/c activities limitations if indicated, with other d/c instructions as indicated by MD - patient able to verbalize understanding, all questions fully answered.   Patient instructed to return to ED, call 911, or call MD for any changes in condition.   Patient to be escorted via WC, and D/C home via private auto.

## 2022-01-04 ENCOUNTER — Other Ambulatory Visit (HOSPITAL_COMMUNITY): Payer: Self-pay

## 2022-01-04 MED ORDER — OXYCODONE HCL 10 MG PO TABS
10.0000 mg | ORAL_TABLET | ORAL | 0 refills | Status: DC | PRN
  Filled 2022-01-04: qty 25, 5d supply, fill #0

## 2022-01-20 ENCOUNTER — Other Ambulatory Visit (HOSPITAL_COMMUNITY): Payer: Self-pay

## 2022-01-20 MED ORDER — METHOCARBAMOL 500 MG PO TABS
500.0000 mg | ORAL_TABLET | Freq: Three times a day (TID) | ORAL | 0 refills | Status: DC | PRN
  Filled 2022-01-20: qty 30, 10d supply, fill #0

## 2022-01-20 MED ORDER — DOCUSATE SODIUM 100 MG PO CAPS: ORAL_CAPSULE | ORAL | 0 refills | Status: DC

## 2022-01-28 ENCOUNTER — Other Ambulatory Visit (HOSPITAL_COMMUNITY): Payer: Self-pay

## 2022-03-25 ENCOUNTER — Ambulatory Visit (INDEPENDENT_AMBULATORY_CARE_PROVIDER_SITE_OTHER): Payer: Medicaid Other

## 2022-03-25 ENCOUNTER — Ambulatory Visit (HOSPITAL_COMMUNITY)
Admission: EM | Admit: 2022-03-25 | Discharge: 2022-03-25 | Disposition: A | Discharge status: Home / Self Care | Payer: Medicaid Other | Attending: Family Medicine | Admitting: Family Medicine

## 2022-03-25 ENCOUNTER — Encounter (HOSPITAL_COMMUNITY): Payer: Self-pay | Admitting: *Deleted

## 2022-03-25 DIAGNOSIS — S62313A Displaced fracture of base of third metacarpal bone, left hand, initial encounter for closed fracture: Secondary | ICD-10-CM

## 2022-03-25 MED ORDER — IBUPROFEN 800 MG PO TABS
800.0000 mg | ORAL_TABLET | Freq: Once | ORAL | Status: AC
  Administered 2022-03-25: 800 mg via ORAL

## 2022-03-25 MED ORDER — IBUPROFEN 800 MG PO TABS
ORAL_TABLET | ORAL | Status: AC
  Filled 2022-03-25: qty 1
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
## 2022-03-25 NOTE — Progress Notes (Signed)
Orthopedic Tech Progress Note Patient Details:  Erica Harrell 1995-07-20 [id number]  Ortho Devices Type of Ortho Device: Volar splint Ortho Device/Splint Location: Long Drosal/Volar LUE Ortho Device/Splint Interventions: Ordered, Application, Adjustment   Post Interventions Patient Tolerated: Well Instructions Provided: Care of device  Erica Harrell 03/25/2022, 11:34 AM

## 2022-03-25 NOTE — ED Triage Notes (Signed)
Pt states she punched a wall on Tuesday and her hand is still swollen and she hears and feels a popping. She took some tylenol without relief.

## 2022-03-25 NOTE — Discharge Instructions (Addendum)
You were diagnosed with a broken bone in your hand.  We have put this in a splint today.   You may take motrin/tylenol for pain.  You need to follow up with the hand surgeon as you may need surgery for this.  Please call Dr. Grandville Silos at [phone] TODAY to make an appointment.  Their address is [place].

## 2022-08-07 IMAGING — CT CT HEAD W/O CM
4 series · 16 of 47 positions shown, 18 images · non-contrast
Comparison: None.

CLINICAL DATA: Found unconscious

EXAM:
CT HEAD WITHOUT CONTRAST
TECHNIQUE: Contiguous axial images were obtained from the base of the skull
through the vertex without intravenous contrast.

[Series 3: head wo · axial · 0.42mm/px · z∈[+1416,+1540]mm · 7 of 35 slices shown, 9 images]
[im 5/35  brain]
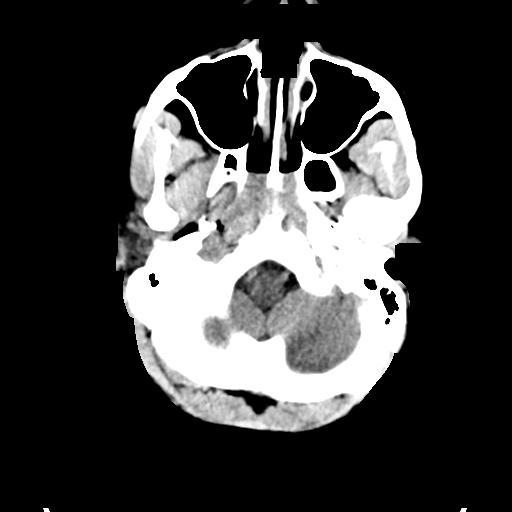
[im 5/35  bone]
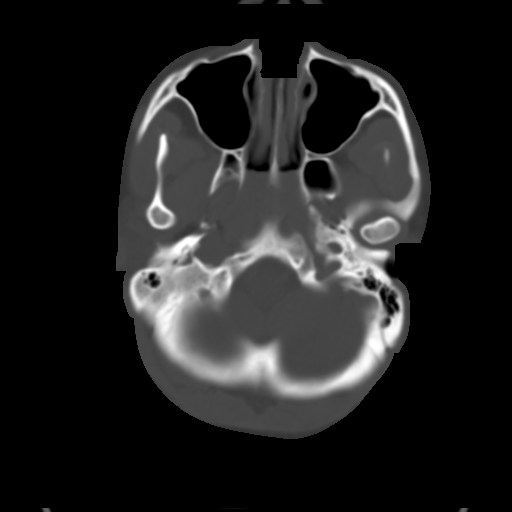
[im 9/35  brain]
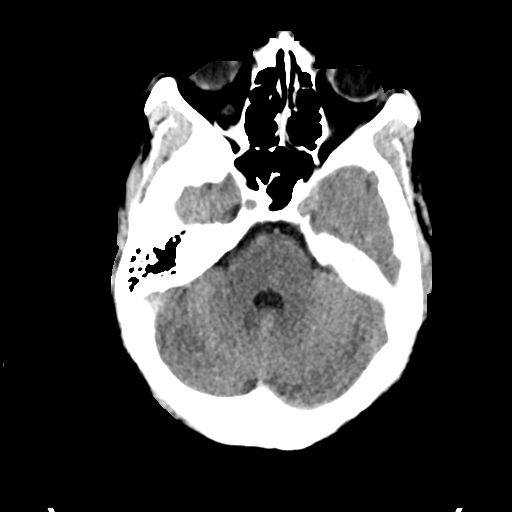
[im 13/35  brain]
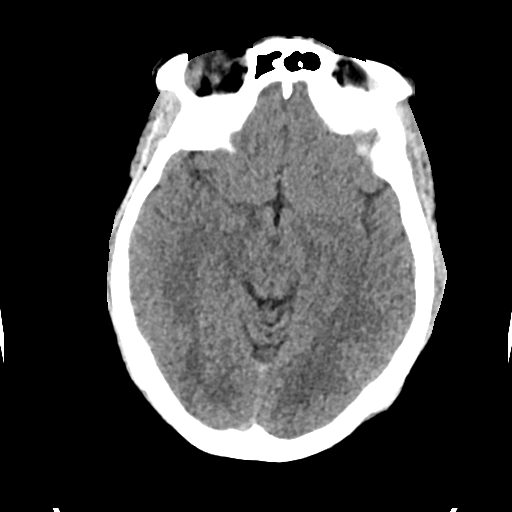
[im 18/35  brain]
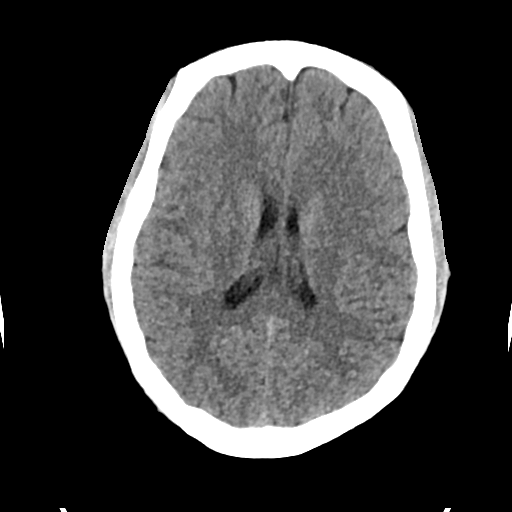
[im 22/35  brain]
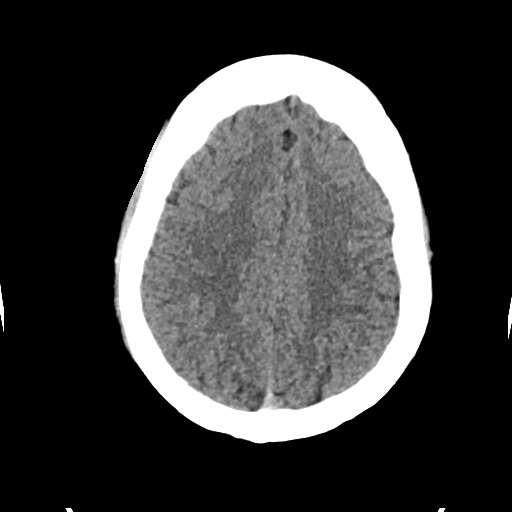
[im 22/35  bone]
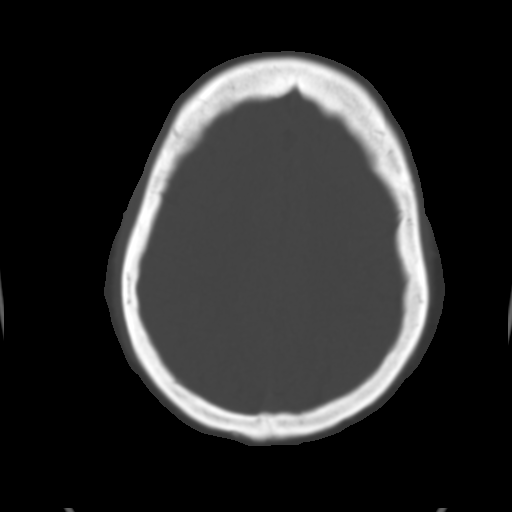
[im 26/35  brain]
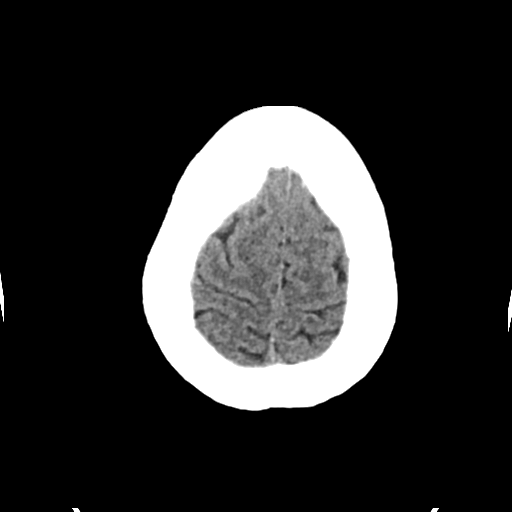
[im 30/35  brain]
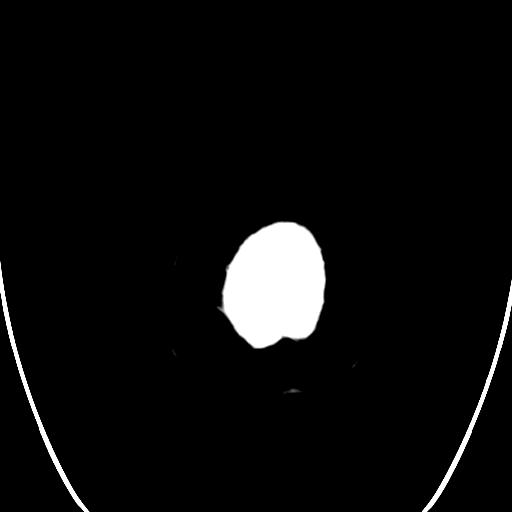

[Series 4: head bone · axial · 0.42mm/px · z∈[+1412,+1446]mm · 3 of 86 slices shown]
[im 9/86  bone]
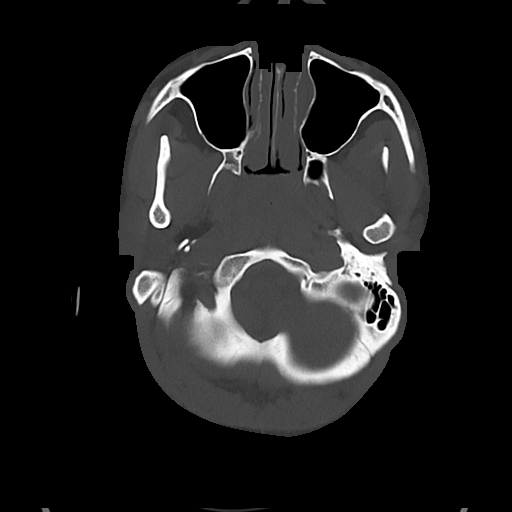
[im 18/86  bone]
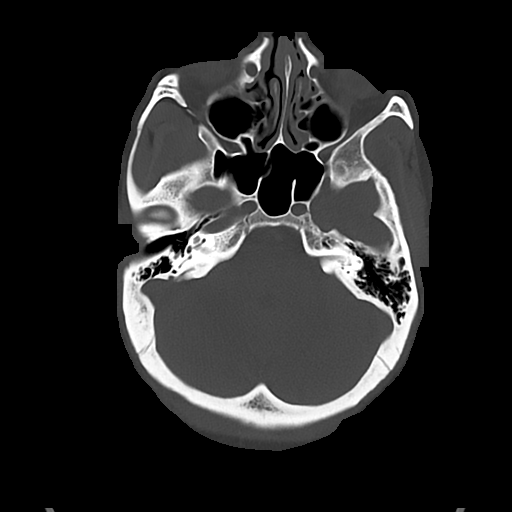
[im 26/86  bone]
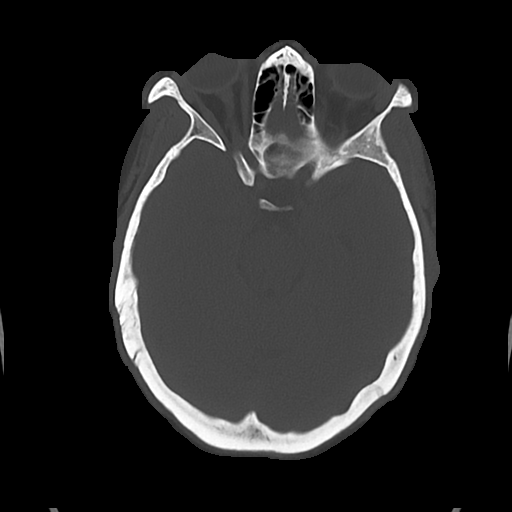

[Series 5: cor soft · coronal · 0.33mm/px · 3 of 73 slices shown]
[im 25/73  brain]
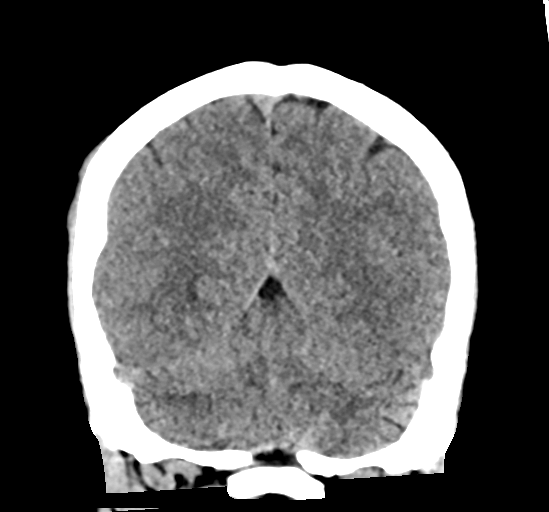
[im 33/73  brain]
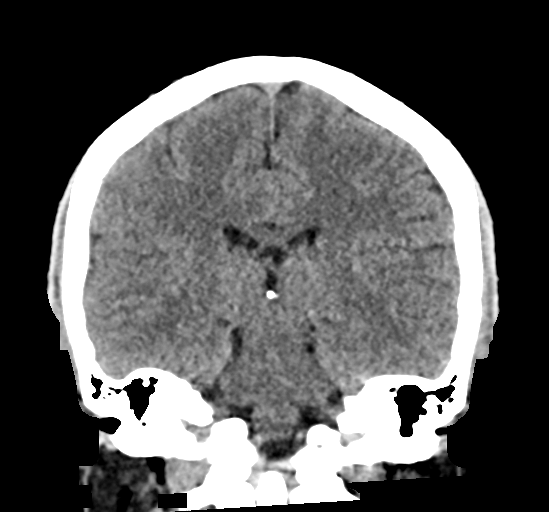
[im 41/73  brain]
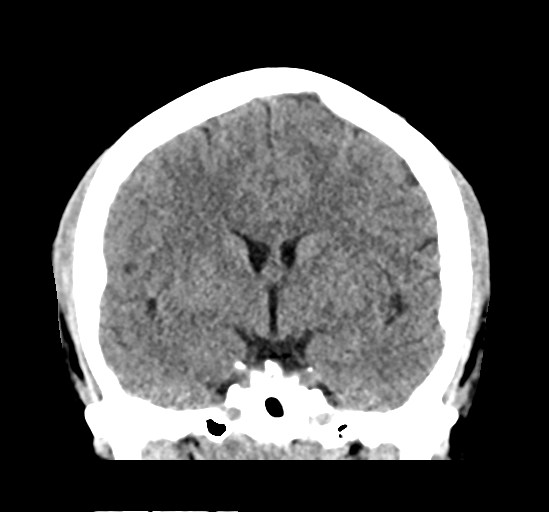

[Series 6: sag soft · sagittal · 0.33mm/px · 3 of 60 slices shown]
[im 20/60  brain]
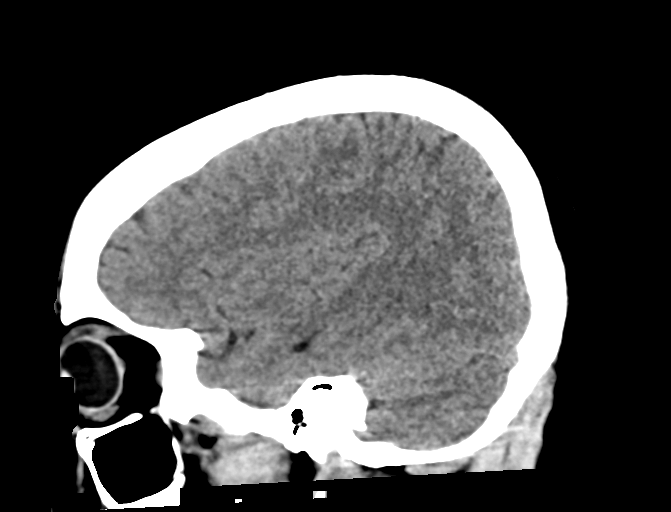
[im 30/60  brain]
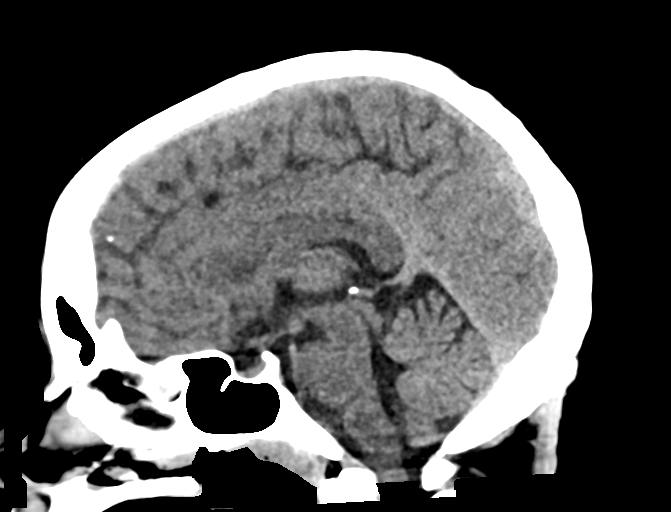
[im 40/60  brain]
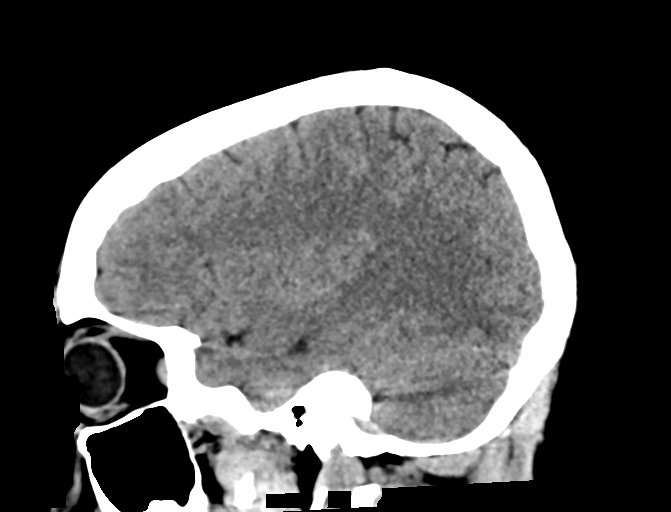

[16 of 47 positions shown; findings below may reference images not displayed]

FINDINGS: Brain: No evidence of acute territorial infarction, hemorrhage,
hydrocephalus,extra-axial collection or mass lesion/mass effect.
Normal gray-white differentiation. Ventricles are normal in size and
contour.

Vascular: No hyperdense vessel or unexpected calcification.

Skull: The skull is intact. No fracture or focal lesion identified.

Sinuses/Orbits: The visualized paranasal sinuses and mastoid air
cells are clear. The orbits and globes intact.

Other: None

Cervical spine:

Alignment: Physiologic

Skull base and vertebrae: Visualized skull base is intact. No
atlanto-occipital dissociation. The vertebral body heights are well
maintained. No fracture or pathologic osseous lesion seen.

Soft tissues and spinal canal: The visualized paraspinal soft
tissues are unremarkable. No prevertebral soft tissue swelling is
seen. The spinal canal is grossly unremarkable, no large epidural
collection or significant canal narrowing.

Disc levels:  No significant canal or neural foraminal narrowing.

Upper chest: The lung apices are clear. Thoracic inlet is within
normal limits.

Other: None
IMPRESSION: No acute intracranial abnormality.

No acute fracture or malalignment of the spine.

## 2022-08-13 ENCOUNTER — Emergency Department (HOSPITAL_COMMUNITY)
Admission: EM | Admit: 2022-08-13 | Discharge: 2022-08-14 | Disposition: A | Discharge status: Home / Self Care | Payer: Medicaid Other | Attending: Emergency Medicine | Admitting: Emergency Medicine

## 2022-08-13 ENCOUNTER — Other Ambulatory Visit: Payer: Self-pay

## 2022-08-13 ENCOUNTER — Encounter (HOSPITAL_COMMUNITY): Payer: Self-pay

## 2022-08-13 DIAGNOSIS — R1030 Lower abdominal pain, unspecified: Secondary | ICD-10-CM | POA: Diagnosis not present

## 2022-08-13 DIAGNOSIS — J45909 Unspecified asthma, uncomplicated: Secondary | ICD-10-CM | POA: Insufficient documentation

## 2022-08-13 DIAGNOSIS — Z3A01 Less than 8 weeks gestation of pregnancy: Secondary | ICD-10-CM | POA: Diagnosis not present

## 2022-08-13 DIAGNOSIS — Z3491 Encounter for supervision of normal pregnancy, unspecified, first trimester: Secondary | ICD-10-CM

## 2022-08-13 DIAGNOSIS — R1033 Periumbilical pain: Secondary | ICD-10-CM | POA: Insufficient documentation

## 2022-08-13 DIAGNOSIS — O26891 Other specified pregnancy related conditions, first trimester: Secondary | ICD-10-CM | POA: Diagnosis not present

## 2022-08-13 LAB — COMPREHENSIVE METABOLIC PANEL
ALT: 12 U/L (ref 0–44)
AST: 16 U/L (ref 15–41)
Albumin: 3.3 g/dL — ABNORMAL LOW (ref 3.5–5.0)
Alkaline Phosphatase: 76 U/L (ref 38–126)
Anion gap: 9 (ref 5–15)
BUN: <5 mg/dL — ABNORMAL LOW (ref 6–20)
CO2: 23 mmol/L (ref 22–32)
Calcium: 9 mg/dL (ref 8.9–10.3)
Chloride: 102 mmol/L (ref 98–111)
Creatinine, Ser: 0.8 mg/dL (ref 0.44–1.00)
GFR, Estimated: >60 mL/min (ref >60)
Glucose, Bld: 107 mg/dL — ABNORMAL HIGH (ref 70–99)
Potassium: 2.7 mmol/L — CL (ref 3.5–5.1)
Sodium: 134 mmol/L — ABNORMAL LOW (ref 135–145)
Total Bilirubin: 0.5 mg/dL (ref 0.3–1.2)
Total Protein: 7.3 g/dL (ref 6.5–8.1)

## 2022-08-13 LAB — CBC
HCT: 39.1 % (ref 36.0–46.0)
Hemoglobin: 13.3 g/dL (ref 12.0–15.0)
MCH: 31 pg (ref 26.0–34.0)
MCHC: 34 g/dL (ref 30.0–36.0)
MCV: 91.1 fL (ref 80.0–100.0)
Platelets: 252 10*3/uL (ref 150–400)
RBC: 4.29 MIL/uL (ref 3.87–5.11)
RDW: 12 % (ref 11.5–15.5)
WBC: 5.8 10*3/uL (ref 4.0–10.5)
nRBC: 0 % (ref 0.0–0.2)

## 2022-08-13 LAB — I-STAT BETA HCG BLOOD, ED (MC, WL, AP ONLY): I-stat hCG, quantitative: >2000 m[IU]/mL — ABNORMAL HIGH (ref <5)

## 2022-08-13 LAB — LIPASE, BLOOD: Lipase: 33 U/L (ref 11–51)

## 2022-08-13 NOTE — ED Triage Notes (Signed)
Pt. Arrives POV c/o abdominal pain in the middle of his stomach. She states that she missed her period and had a positive home pregnancy test. She states that today she has been having severe abdominal pain. She states that she has not had any bleeding just brown discharge. She also states that she has had 3 children in the past and this feels very different.

## 2022-08-14 ENCOUNTER — Emergency Department (HOSPITAL_COMMUNITY): Payer: Medicaid Other

## 2022-08-14 ENCOUNTER — Encounter (HOSPITAL_COMMUNITY): Payer: Self-pay

## 2022-08-14 LAB — URINALYSIS, ROUTINE W REFLEX MICROSCOPIC
Bacteria, UA: NONE SEEN
Bilirubin Urine: NEGATIVE
Glucose, UA: NEGATIVE mg/dL
Ketones, ur: NEGATIVE mg/dL
Leukocytes,Ua: NEGATIVE
Nitrite: NEGATIVE
Protein, ur: NEGATIVE mg/dL
Specific Gravity, Urine: 1.005 (ref 1.005–1.030)
pH: 6 (ref 5.0–8.0)

## 2022-08-14 LAB — HCG, QUANTITATIVE, PREGNANCY: hCG, Beta Chain, Quant, S: 3636 m[IU]/mL — ABNORMAL HIGH (ref <5)

## 2022-08-14 LAB — ABO/RH: ABO/RH(D): AB POS

## 2022-08-14 LAB — MAGNESIUM: Magnesium: 1.7 mg/dL (ref 1.7–2.4)

## 2022-08-14 MED ORDER — SODIUM CHLORIDE 0.9 % IV BOLUS
1000.0000 mL | Freq: Once | INTRAVENOUS | Status: AC
  Administered 2022-08-14: 1000 mL via INTRAVENOUS

## 2022-08-14 MED ORDER — POTASSIUM CHLORIDE CRYS ER 20 MEQ PO TBCR
60.0000 meq | EXTENDED_RELEASE_TABLET | Freq: Once | ORAL | Status: AC
  Administered 2022-08-14: 60 meq via ORAL
  Filled 2022-08-14: qty 3

## 2022-08-14 MED ORDER — ONDANSETRON HCL 4 MG/2ML IJ SOLN
4.0000 mg | Freq: Once | INTRAMUSCULAR | Status: AC
  Administered 2022-08-14: 4 mg via INTRAVENOUS
  Filled 2022-08-14: qty 2

## 2022-08-14 MED ORDER — ACETAMINOPHEN 500 MG PO TABS
1000.0000 mg | ORAL_TABLET | Freq: Once | ORAL | Status: DC
  Filled 2022-08-14: qty 2

## 2022-08-14 MED ORDER — POTASSIUM CHLORIDE 10 MEQ/100ML IV SOLN
10.0000 meq | Freq: Once | INTRAVENOUS | Status: AC
  Administered 2022-08-14: 10 meq via INTRAVENOUS
  Filled 2022-08-14: qty 100

## 2022-08-14 MED ORDER — HYDROMORPHONE HCL 1 MG/ML IJ SOLN
0.5000 mg | Freq: Once | INTRAMUSCULAR | Status: AC
  Administered 2022-08-14: 0.5 mg via INTRAVENOUS
  Filled 2022-08-14: qty 1

## 2022-08-14 NOTE — Discharge Instructions (Addendum)
Your ultrasound shows that you are [redacted] weeks pregnant, recommend starting a prenatal, I have given you information on what to expect during pregnancy.  I would like you to follow-up with your OB/GYN for further evaluation, if you do not have one you can follow-up with women's health.  Your stomach pain is likely benign, but it is a possibility that you could have the beginning of appendicitis, come back in for reassessment in the next 24 to 48 hours or if your stomach pain gets worse  Come back to the emergency department if you develop chest pain, shortness of breath, severe abdominal pain, uncontrolled nausea, vomiting, diarrhea.

## 2022-08-14 NOTE — ED Provider Notes (Signed)
[place] EMERGENCY DEPARTMENT AT [facility] Provider Note   CSN: [id number] Arrival date & time: 08/13/22  2243     History  Chief Complaint  Patient presents with   Abdominal Pain    Erica Harrell is a 27 y.o. female.  HPI   Patient with medical history including constipation, PID, asthma, bacterial vaginosis, status post C-section, exploratory laparotomy presenting with complaints of abdominal pain, states it is periumbilical, been going on for last 3 days, intermittent, worsened with p.o. intake, or certain movements, does not radiate, she has had associated nausea vomiting diarrhea, denies any bloody emesis or coffee-ground emesis, denies any urinary symptoms no dysuria hematuria, denies any vaginal discharge vaginal bleeding, [place] any pelvic pain.  States that her last menstrual cycle was May 15, states that she is regular, currently not on any oral birth control, no history of ovarian torsion or ectopic pregnancies.  She denies excessive alcohol use or NSAID use, states that she is never had this past.    Home Medications Prior to Admission medications   Medication Sig Start Date End Date Taking? Authorizing Provider  acetaminophen (TYLENOL) 500 MG tablet Take 2 tablets (1,000 mg total) by mouth every 6 (six) hours as needed. Patient not taking: Reported on 08/14/2022 12/30/21   Barnetta Chapel, PA-C  albuterol (VENTOLIN HFA) 108 (90 Base) MCG/ACT inhaler Inhale 2 puffs into the lungs every 6 (six) hours as needed for wheezing or shortness of breath. Patient not taking: Reported on 08/14/2022 08/09/19   Warden Fillers, MD  Blood Pressure Monitoring (BLOOD PRESSURE KIT) DEVI 1 Device by Does not apply route as needed. 01/21/19   Rasch, Victorino Dike I, NP  docusate sodium (COLACE) 100 MG capsule Take 1 capsule (100 mg total) by mouth 2 (two) times daily for 10 days Patient not taking: Reported on 08/14/2022 01/20/22     famotidine (PEPCID) 20 MG tablet Take 1 tablet (20  mg total) by mouth 2 (two) times daily. Patient not taking: Reported on 08/14/2022 06/12/19   Tereso Newcomer, MD  ferrous sulfate 325 (65 FE) MG tablet Take 1 tablet (325 mg total) by mouth every other day. Patient not taking: Reported on 08/14/2022 08/24/19   Venora Maples, MD  ibuprofen (ADVIL) 800 MG tablet Take 1 tablet (800 mg total) by mouth every 8 (eight) hours. Patient not taking: Reported on 08/14/2022 08/23/19   Venora Maples, MD  methocarbamol (ROBAXIN) 500 MG tablet Take 1 tablet (500 mg total) by mouth every 8 (eight) hours as needed for pain for up to 10 days Patient not taking: Reported on 08/14/2022 01/20/22     naproxen (NAPROSYN) 500 MG tablet Take 1 tablet (500 mg total) by mouth 2 (two) times daily. Patient not taking: Reported on 08/14/2022 03/08/20   Elpidio Anis, PA-C  oxyCODONE (OXY IR/ROXICODONE) 5 MG immediate release tablet Take 1 tablet (5 mg total) by mouth every 4 (four) hours as needed for moderate pain. Patient not taking: Reported on 08/14/2022 08/23/19   Venora Maples, MD  Oxycodone HCl 10 MG TABS Take 1-1.5 tablets (10-15 mg total) by mouth every 4 (four) hours as needed for severe pain for up to 5 days Patient not taking: Reported on 08/14/2022 01/04/22     pantoprazole (PROTONIX) 40 MG tablet Take 1 tablet (40 mg total) by mouth daily. Patient not taking: Reported on 08/14/2022 07/25/19   Rasch, Victorino Dike I, NP  polyethylene glycol powder (GLYCOLAX/MIRALAX) 17 GM/SCOOP powder Take 17 g by mouth  daily as needed. Patient not taking: Reported on 08/14/2022 08/23/19   Venora Maples, MD  Prenatal Vit-Fe Fumarate-FA (PREPLUS) 27-1 MG TABS Take 1 tablet by mouth daily. Patient not taking: Reported on 08/14/2022 01/21/19   Rasch, Harolyn Rutherford, NP      Allergies    Shellfish allergy, Zoloft [sertraline hcl], and Shellfish allergy    Review of Systems   Review of Systems  Constitutional:  Negative for chills and fever.  Respiratory:  Negative for shortness of  breath.   Cardiovascular:  Negative for chest pain.  Gastrointestinal:  Positive for abdominal pain, diarrhea, nausea and vomiting.  Neurological:  Negative for headaches.    Physical Exam Updated Vital Signs BP (!) 131/92   Pulse 79   Temp 98.2 F (36.8 C) (Oral)   Resp 17   LMP 07/06/2022 (Exact Date)   SpO2 100%  Physical Exam Vitals and nursing note reviewed.  Constitutional:      General: She is not in acute distress.    Appearance: She is not ill-appearing.  HENT:     Head: Normocephalic and atraumatic.     Nose: No congestion.  Eyes:     Conjunctiva/sclera: Conjunctivae normal.  Cardiovascular:     Rate and Rhythm: Normal rate and regular rhythm.     Pulses: Normal pulses.     Heart sounds: No murmur heard.    No friction rub. No gallop.  Pulmonary:     Effort: No respiratory distress.     Breath sounds: No wheezing, rhonchi or rales.  Abdominal:     Palpations: Abdomen is soft.     Tenderness: There is abdominal tenderness. There is no right CVA tenderness or left CVA tenderness.     Comments: Surgical scar noted above the umbilicus, no distention, abdomen is soft, slight tenderness on around the periumbilical region without guarding rebound tenderness or peritoneal sign no suprapubic pain or lower adnexal pain.  No CVA tenderness.  Skin:    General: Skin is warm and dry.  Neurological:     Mental Status: She is alert.  Psychiatric:        Mood and Affect: Mood normal.     ED Results / Procedures / Treatments   Labs (all labs ordered are listed, but only abnormal results are displayed) Labs Reviewed  COMPREHENSIVE METABOLIC PANEL - Abnormal; Notable for the following components:      Result Value   Sodium 134 (*)    Potassium 2.7 (*)    Glucose, Bld 107 (*)    BUN <5 (*)    Albumin 3.3 (*)    All other components within normal limits  HCG, QUANTITATIVE, PREGNANCY - Abnormal; Notable for the following components:   hCG, Beta Chain, Quant, S 3,636 (*)     All other components within normal limits  URINALYSIS, ROUTINE W REFLEX MICROSCOPIC - Abnormal; Notable for the following components:   Color, Urine STRAW (*)    Hgb urine dipstick SMALL (*)    All other components within normal limits  I-STAT BETA HCG BLOOD, ED ([facility], [facility], [facility] ONLY) - Abnormal; Notable for the following components:   I-stat hCG, quantitative >2,000.0 (*)    All other components within normal limits  LIPASE, BLOOD  CBC  URINALYSIS, ROUTINE W REFLEX MICROSCOPIC  MAGNESIUM  ABO/RH    EKG None  Radiology US OB LESS THAN 14 WEEKS WITH OB TRANSVAGINAL  Result Date: 08/14/2022 CLINICAL DATA:  Pain.  Pregnant.  EDC by LMP 04/12/2023. EXAM:  Ob transvaginal ultrasound TECHNIQUE: Ob transabdominal and transvaginalultrasound examination of the pelvis was performed . COMPARISON:  None Available. FINDINGS: Uterus is anteverted and measures 8 x 6 x 6 cm. There is an intrauterine sac-like structure which would correspond to a menstrual age of 5 weeks 2 days by mean sac diameter of 5 mm. No yolk sac or fetal pole is seen. Images of the adnexa demonstrate no masses or fluid collections. There is a unremarkable right ovary measuring 3.8 x 2.5 x 1.5 cm with a 2.2 cm corpus luteum. Left ovary was not seen. IMPRESSION: 5+ week intrauterine gestational sac. Ultrasound EDC based on mean sac diameter 04/14/2023. No yolk sac or fetal pole identified. No adnexal pathology. Electronically Signed   By: Layla Maw M.D.   On: 08/14/2022 01:16    Procedures Procedures    Medications Ordered in ED Medications  potassium chloride SA (KLOR-CON M) CR tablet 60 mEq (60 mEq Oral Given 08/14/22 0151)  potassium chloride 10 mEq in 100 mL IVPB (0 mEq Intravenous Stopped 08/14/22 0300)  sodium chloride 0.9 % bolus 1,000 mL (0 mLs Intravenous Stopped 08/14/22 0300)  HYDROmorphone (DILAUDID) injection 0.5 mg (0.5 mg Intravenous Given 08/14/22 0158)  ondansetron (ZOFRAN) injection 4 mg (4 mg Intravenous Given  08/14/22 0158)    ED Course/ Medical Decision Making/ A&P                             Medical Decision Making Amount and/or Complexity of Data Reviewed Labs: ordered. Radiology: ordered.  Risk OTC drugs. Prescription drug management.   This patient presents to the ED for concern of abdominal pain, this involves an extensive number of treatment options, and is a complaint that carries with it a high risk of complications and morbidity.  The differential diagnosis includes ectopic pregnancy, ovarian torsion, PID, UTI, appendicitis, bowel obstruction    Additional history obtained:  Additional history obtained from N/A External records from outside source obtained and reviewed including recent hospitalization   Co morbidities that complicate the patient evaluation  C-sections, exploratory laparotomy  Social Determinants of Health:  N/A    Lab Tests:  I Ordered, and personally interpreted labs.  The pertinent results include: CBC unremarkable, CMP reveals potassium 2.7, glucose 107, albumin 3.3, lipase 33, i-STAT hCG reveals greater than 2000, quantitative hCG is 3600, Rh+, UA is negative   Imaging Studies ordered:  I ordered imaging studies including transvaginal ultrasound I independently visualized and interpreted imaging which showed 5-week intrauterine gestation I agree with the radiologist interpretation   Cardiac Monitoring:  The patient was maintained on a cardiac monitor.  I personally viewed and interpreted the cardiac monitored which showed an underlying rhythm of: N/A   Medicines ordered and prescription drug management:  I ordered medication including fluids, pain medication, antiemetics, potassium I have reviewed the patients home medicines and have made adjustments as needed  Critical Interventions:  N/A   Reevaluation:  Presents with abdominal pain, triage obtain basic lab workup which I personally viewed, shows positive pregnancy, with  abdominal pain I am concerned for possible ectopic pregnancy, will further assess with transvaginal ultrasound.  Lab workup shows hypokalemia suspect from GI loss, will provide oral potassium with IV potassium add on a magnesium.  Patient was reassessed she is found resting comfortably, vital signs are reassuring, she is tolerating p.o., she is agreement discharge at this time  Consultations Obtained:  N/A    Test Considered:  CT  abdomen pelvis-with shared decision making this will be deferred, as advised patient for acute appendicitis is low she is afebrile nontachycardic no leukocytosis, pain is completely resolved, patient states that if pain comes back she will come back in for reassessment.  I find this reasonable as you do not want to unnecessarily expose the fetus to radiation    Rule out Suspicion for ectopic pregnancy, ovarian torsion, low at this time as ultrasound negative these findings.  I doubt PID as patient denies vaginal discharge, no pelvic pain.  I doubt UTI [place] or kidney stone endorsing any urinary symptoms UA is Infection or hematuria. low suspicion for lower lobe pneumonia as lung sounds are clear bilaterally, will defer imaging at this time.  I have low suspicion for liver or gallbladder abnormality as she has no right upper quadrant tenderness, liver enzymes, alk phos, T bili all within normal limits.  Low suspicion for pancreatitis as lipase is within normal limits.  Low suspicion for ruptured stomach ulcer as she has no peritoneal sign present on exam.  Low suspicion for bowel obstruction as abdomen is nondistended normal bowel sounds, so passing gas and having normal bowel movements.  Low suspicion for complicated diverticulitis as she is nontoxic-appearing, vital signs reassuring no leukocytosis present.  Low suspicion for appendicitis as she has no right lower quadrant tenderness, vital signs reassuring.   Dispostion and problem list  After consideration of the  diagnostic results and the patients response to treatment, I feel that the patent would benefit from discharge.   [redacted] weeks pregnant-patient was made aware of early pregnancy, explained her to avoid NSAIDs at this time, start a prenatal, will have her follow-up with OB/GYN for further assessment. Periumbilical pain-since resolved, I suspect likely pelvic cramping, but there is possibly could be the beginning of appendicitis, patient was made aware this, and I recommend that she comes back in for reassessment next 24 to 48 hours for repeat lab workup and/or come back further if symptoms are worsening.            Final Clinical Impression(s) / ED Diagnoses Final diagnoses:  First trimester pregnancy    Rx / DC Orders ED Discharge Orders     None         Carroll Sage, PA-C 08/14/22 0359    Maia Plan, MD 08/19/22 [phone]

## 2022-09-11 ENCOUNTER — Emergency Department (HOSPITAL_COMMUNITY)
Admission: EM | Admit: 2022-09-11 | Discharge: 2022-09-11 | Disposition: A | Discharge status: Home / Self Care | Payer: 59 | Attending: Emergency Medicine | Admitting: Emergency Medicine

## 2022-09-11 ENCOUNTER — Encounter (HOSPITAL_COMMUNITY): Payer: Self-pay

## 2022-09-11 ENCOUNTER — Other Ambulatory Visit: Payer: Self-pay

## 2022-09-11 ENCOUNTER — Emergency Department (HOSPITAL_COMMUNITY): Payer: 59

## 2022-09-11 DIAGNOSIS — R030 Elevated blood-pressure reading, without diagnosis of hypertension: Secondary | ICD-10-CM

## 2022-09-11 DIAGNOSIS — E871 Hypo-osmolality and hyponatremia: Secondary | ICD-10-CM

## 2022-09-11 DIAGNOSIS — O200 Threatened abortion: Secondary | ICD-10-CM | POA: Diagnosis present

## 2022-09-11 DIAGNOSIS — D649 Anemia, unspecified: Secondary | ICD-10-CM | POA: Diagnosis not present

## 2022-09-11 DIAGNOSIS — O039 Complete or unspecified spontaneous abortion without complication: Secondary | ICD-10-CM

## 2022-09-11 LAB — CBC WITH DIFFERENTIAL/PLATELET
Abs Immature Granulocytes: 0.03 10*3/uL (ref 0.00–0.07)
Basophils Absolute: 0 10*3/uL (ref 0.0–0.1)
Basophils Relative: 1 %
Eosinophils Absolute: 0.3 10*3/uL (ref 0.0–0.5)
Eosinophils Relative: 4 %
HCT: 36.1 % (ref 36.0–46.0)
Hemoglobin: 11.8 g/dL — ABNORMAL LOW (ref 12.0–15.0)
Immature Granulocytes: 0 %
Lymphocytes Relative: 30 %
Lymphs Abs: 2.3 10*3/uL (ref 0.7–4.0)
MCH: 30.6 pg (ref 26.0–34.0)
MCHC: 32.7 g/dL (ref 30.0–36.0)
MCV: 93.8 fL (ref 80.0–100.0)
Monocytes Absolute: 0.9 10*3/uL (ref 0.1–1.0)
Monocytes Relative: 11 %
Neutro Abs: 4.2 10*3/uL (ref 1.7–7.7)
Neutrophils Relative %: 54 %
Platelets: 256 10*3/uL (ref 150–400)
RBC: 3.85 MIL/uL — ABNORMAL LOW (ref 3.87–5.11)
RDW: 12.3 % (ref 11.5–15.5)
WBC: 7.7 10*3/uL (ref 4.0–10.5)
nRBC: 0 % (ref 0.0–0.2)

## 2022-09-11 LAB — RPR: RPR Ser Ql: NONREACTIVE

## 2022-09-11 LAB — BASIC METABOLIC PANEL
Anion gap: 7 (ref 5–15)
BUN: 9 mg/dL (ref 6–20)
CO2: 23 mmol/L (ref 22–32)
Calcium: 8.5 mg/dL — ABNORMAL LOW (ref 8.9–10.3)
Chloride: 102 mmol/L (ref 98–111)
Creatinine, Ser: 0.65 mg/dL (ref 0.44–1.00)
GFR, Estimated: >60 mL/min (ref >60)
Glucose, Bld: 97 mg/dL (ref 70–99)
Potassium: 3.5 mmol/L (ref 3.5–5.1)
Sodium: 132 mmol/L — ABNORMAL LOW (ref 135–145)

## 2022-09-11 LAB — HCG, SERUM, QUALITATIVE: Preg, Serum: POSITIVE — AB

## 2022-09-11 LAB — HCG, QUANTITATIVE, PREGNANCY: hCG, Beta Chain, Quant, S: 23509 m[IU]/mL — ABNORMAL HIGH (ref <5)

## 2022-09-11 LAB — HIV ANTIBODY (ROUTINE TESTING W REFLEX): HIV Screen 4th Generation wRfx: NONREACTIVE

## 2022-09-11 MED ORDER — HYDROMORPHONE HCL 1 MG/ML IJ SOLN
1.0000 mg | Freq: Once | INTRAMUSCULAR | Status: AC
  Administered 2022-09-11: 1 mg via INTRAVENOUS
  Filled 2022-09-11: qty 1

## 2022-09-11 MED ORDER — OXYCODONE-ACETAMINOPHEN 5-325 MG PO TABS
1.0000 | ORAL_TABLET | Freq: Once | ORAL | Status: AC
  Administered 2022-09-11: 1 via ORAL
  Filled 2022-09-11: qty 1

## 2022-09-11 MED ORDER — ONDANSETRON HCL 4 MG/2ML IJ SOLN
4.0000 mg | Freq: Once | INTRAMUSCULAR | Status: AC
  Administered 2022-09-11: 4 mg via INTRAVENOUS
  Filled 2022-09-11: qty 2

## 2022-09-11 MED ORDER — OXYCODONE-ACETAMINOPHEN 5-325 MG PO TABS: 1.0000 | ORAL_TABLET | Freq: Four times a day (QID) | ORAL | 0 refills | Status: AC | PRN

## 2022-09-11 MED ORDER — MORPHINE SULFATE (PF) 4 MG/ML IV SOLN
4.0000 mg | Freq: Once | INTRAVENOUS | Status: AC
  Administered 2022-09-11: 4 mg via INTRAVENOUS
  Filled 2022-09-11: qty 1

## 2022-09-11 NOTE — Discharge Instructions (Signed)
If pain is not being adequately controlled, or if bleeding is getting significantly worse, then return to the emergency department for reevaluation.  Your blood pressure was very high today.  That is most likely because you have been in a lot of pain.  However, it could also be because you have high blood pressure.  Please have your blood pressure rechecked several times in the next 1-2 weeks.  If it continues to be high, you will need to be on medication to control it.  High blood pressure which is not adequately controlled can lead to heart attacks, strokes, kidney failure.

## 2022-09-11 NOTE — ED Notes (Signed)
Assisted provider with pelvic exam, pt tolerated well. Pt provided with sanitary supplies and assisted to restroom

## 2022-09-11 NOTE — ED Provider Notes (Signed)
[place] EMERGENCY DEPARTMENT AT [facility] Provider Note   CSN: [id number] Arrival date & time: 09/11/22  7829     History  Chief Complaint  Patient presents with   Threatened Miscarriage    Erica Harrell is a 27 y.o. female.  The history is provided by the patient.  She has history of asthma, PID and is currently pregnant and comes in with bleeding and lower abdominal cramping concerning for miscarriage.  Last menses was 07/06/2022.  She had been seen in the ED on 08/13/2022 at which time ultrasound showed intrauterine pregnancy at 5 weeks 2 days.  About 10 days ago, she started having some spotting.  Today she has had severe lower abdominal cramping and has had increased vaginal bleeding.  She is gravida 5, para 3 with 1 miscarriage.   Home Medications Prior to Admission medications   Medication Sig Start Date End Date Taking? Authorizing Provider  acetaminophen (TYLENOL) 500 MG tablet Take 2 tablets (1,000 mg total) by mouth every 6 (six) hours as needed. Patient not taking: Reported on 08/14/2022 12/30/21   Barnetta Chapel, PA-C  albuterol (VENTOLIN HFA) 108 (90 Base) MCG/ACT inhaler Inhale 2 puffs into the lungs every 6 (six) hours as needed for wheezing or shortness of breath. Patient not taking: Reported on 08/14/2022 08/09/19   Warden Fillers, MD  Blood Pressure Monitoring (BLOOD PRESSURE KIT) DEVI 1 Device by Does not apply route as needed. 01/21/19   Rasch, Victorino Dike I, NP  docusate sodium (COLACE) 100 MG capsule Take 1 capsule (100 mg total) by mouth 2 (two) times daily for 10 days Patient not taking: Reported on 08/14/2022 01/20/22     famotidine (PEPCID) 20 MG tablet Take 1 tablet (20 mg total) by mouth 2 (two) times daily. Patient not taking: Reported on 08/14/2022 06/12/19   Tereso Newcomer, MD  ferrous sulfate 325 (65 FE) MG tablet Take 1 tablet (325 mg total) by mouth every other day. Patient not taking: Reported on 08/14/2022 08/24/19   Venora Maples,  MD  ibuprofen (ADVIL) 800 MG tablet Take 1 tablet (800 mg total) by mouth every 8 (eight) hours. Patient not taking: Reported on 08/14/2022 08/23/19   Venora Maples, MD  methocarbamol (ROBAXIN) 500 MG tablet Take 1 tablet (500 mg total) by mouth every 8 (eight) hours as needed for pain for up to 10 days Patient not taking: Reported on 08/14/2022 01/20/22     naproxen (NAPROSYN) 500 MG tablet Take 1 tablet (500 mg total) by mouth 2 (two) times daily. Patient not taking: Reported on 08/14/2022 03/08/20   Elpidio Anis, PA-C  oxyCODONE (OXY IR/ROXICODONE) 5 MG immediate release tablet Take 1 tablet (5 mg total) by mouth every 4 (four) hours as needed for moderate pain. Patient not taking: Reported on 08/14/2022 08/23/19   Venora Maples, MD  Oxycodone HCl 10 MG TABS Take 1-1.5 tablets (10-15 mg total) by mouth every 4 (four) hours as needed for severe pain for up to 5 days Patient not taking: Reported on 08/14/2022 01/04/22     pantoprazole (PROTONIX) 40 MG tablet Take 1 tablet (40 mg total) by mouth daily. Patient not taking: Reported on 08/14/2022 07/25/19   Rasch, Victorino Dike I, NP  polyethylene glycol powder (GLYCOLAX/MIRALAX) 17 GM/SCOOP powder Take 17 g by mouth daily as needed. Patient not taking: Reported on 08/14/2022 08/23/19   Venora Maples, MD  Prenatal Vit-Fe Fumarate-FA (PREPLUS) 27-1 MG TABS Take 1 tablet by mouth daily. Patient not taking:  Reported on 08/14/2022 01/21/19   Rasch, Harolyn Rutherford, NP      Allergies    Shellfish allergy, Zoloft [sertraline hcl], and Shellfish allergy    Review of Systems   Review of Systems  All other systems reviewed and are negative.   Physical Exam Updated Vital Signs BP (!) 162/133 (BP Location: Left Arm)   Pulse 78   Temp 98.4 F (36.9 C) (Oral)   Resp 18   Ht 5\' 4"  (1.626 m)   Wt 79.4 kg   LMP 07/06/2022 (Exact Date)   SpO2 100%   BMI 30.04 kg/m  Physical Exam Vitals and nursing note reviewed.   27 year old female, resting  comfortably and in no acute distress. Vital signs are significant for elevated blood pressure. Oxygen saturation is 100%, which is normal. Head is normocephalic and atraumatic. PERRLA, EOMI.  Back is nontender and there is no CVA tenderness. Lungs are clear without rales, wheezes, or rhonchi. Chest is nontender. Heart has regular rate and rhythm without murmur. Abdomen is soft, flat, with moderate suprapubic tenderness. Pelvic: Normal external female genitalia.  Moderate amount of blood present in the vaginal vault with small clots present.  Cervix only minimally open.  Difficult to assess fundal size due to poor cooperation. Extremities have no cyanosis or edema, full range of motion is present. Skin is warm and dry without rash. Neurologic: Mental status is normal, cranial nerves are intact, moves all extremities equally.  ED Results / Procedures / Treatments   Labs (all labs ordered are listed, but only abnormal results are displayed) Labs Reviewed  BASIC METABOLIC PANEL - Abnormal; Notable for the following components:      Result Value   Sodium 132 (*)    Calcium 8.5 (*)    All other components within normal limits  CBC WITH DIFFERENTIAL/PLATELET - Abnormal; Notable for the following components:   RBC 3.85 (*)    Hemoglobin 11.8 (*)    All other components within normal limits  HCG, SERUM, QUALITATIVE - Abnormal; Notable for the following components:   Preg, Serum POSITIVE (*)    All other components within normal limits  HCG, QUANTITATIVE, PREGNANCY - Abnormal; Notable for the following components:   hCG, Beta Chain, Quant, S 23,509 (*)    All other components within normal limits  URINALYSIS, ROUTINE W REFLEX MICROSCOPIC  RPR  HIV ANTIBODY (ROUTINE TESTING W REFLEX)  GC/CHLAMYDIA PROBE AMP ([place]) NOT AT [facility]   Radiology US OB Comp Less 14 Wks  Result Date: 09/11/2022 CLINICAL DATA:  First trimester pregnancy with vaginal bleeding. Gestational age of [redacted] weeks 4  days. EXAM: OBSTETRIC <14 WK [place] AND TRANSVAGINAL OB US TECHNIQUE: Both transabdominal and transvaginal ultrasound examinations were performed for complete evaluation of the gestation as well as the maternal uterus, adnexal regions, and pelvic cul-de-sac. Transvaginal technique was performed to assess early pregnancy. COMPARISON:  08/14/2022 FINDINGS: Flattened sac-like structure at the lower uterine segment which measures 14 x 5.6 x 8.4 mm which would be a mean sac diameter of 9 cc. No evidence of uterine mass. The ovaries are not clearly visualized, no adnexal mass. No pelvic fluid. No current quantitative beta HCG or chart indication of second pregnancy. IMPRESSION: Flattened, empty sac like structure with 9 cc mean sac diameter at the lower uterine segment. Compared to recent ultrasound and clinical dates, findings suggest abortion in progress. Consider follow-up ultrasound in 10 days and serial quantitative beta HCG follow-up. Electronically Signed   By: Christiane Ha  Watts M.D.   On: 09/11/2022 05:17    Procedures Procedures    Medications Ordered in ED Medications  oxyCODONE-acetaminophen (PERCOCET/ROXICET) 5-325 MG per tablet 1 tablet (1 tablet Oral Given 09/11/22 0416)  morphine (PF) 4 MG/ML injection 4 mg (4 mg Intravenous Given 09/11/22 0444)  HYDROmorphone (DILAUDID) injection 1 mg (1 mg Intravenous Given 09/11/22 0554)  ondansetron (ZOFRAN) injection 4 mg (4 mg Intravenous Given 09/11/22 0554)    ED Course/ Medical Decision Making/ A&P                             Medical Decision Making Amount and/or Complexity of Data Reviewed Labs: ordered. Radiology: ordered.  Risk Prescription drug management.   First trimester bleeding, threatened miscarriage.  Low risk for ectopic pregnancy given history of ultrasound showing IUP.  I reviewed her past records confirming ultrasound on 08/14/2022 showing gestational age of [redacted] weeks 2 days which would put her at 9 weeks 2 days today.  I have ordered  CBC and quantitative hCG.  She was hypokalemic at her last ED visit so I have ordered a basic metabolic panel.  I do not see any evidence of STI panel done, so I have ordered STI panel.  I have ordered a dose of oxycodone-acetaminophen for pain.  Blood type is AB+, not a RhoGAM candidate.  I am ordering a pelvic ultrasound to rule out heterotopic pregnancy.  She did not get adequate pain relief with initial dose of morphine, and I ordered a dose of hydromorphone which did give her adequate pain relief.  I have reviewed and interpreted her laboratory tests, and my interpretation is mild anemia with hemoglobin having dropped 1.5 g compared with 08/13/2022 likely secondary to blood loss from miscarriage, mild hyponatremia which is not felt to be clinically significant, hCG level of 23,509 which is lower than would be expected to be at this stage of pregnancy.  Pelvic ultrasoun looks like miscarriage in process, no evidence of fetal viability.  I have independently viewed the images, and agree with radiologist's interpretation.  I am discharging patient with prescription for oxycodone-acetaminophen for pain, referred to women's outpatient clinic for follow-up in 1 week.  She is to return for worsening bleeding or inadequate pain control.  Final Clinical Impression(s) / ED Diagnoses Final diagnoses:  Miscarriage  Elevated blood pressure reading without diagnosis of hypertension  Hyponatremia  Normochromic normocytic anemia    Rx / DC Orders ED Discharge Orders          Ordered    oxyCODONE-acetaminophen (PERCOCET/ROXICET) 5-325 MG tablet  Every 6 hours PRN        09/11/22 0706              Dione Booze, MD 09/11/22 [id number]

## 2022-09-11 NOTE — ED Triage Notes (Signed)
Pt. Arrives pov c/o a possible miscarriage. Pt. Has been experiencing spotting over the past week. She states that she started bleeding heavier today and having abdominal pain.

## 2022-09-12 LAB — GC/CHLAMYDIA PROBE AMP (~~LOC~~) NOT AT ARMC
Chlamydia: NEGATIVE
Comment: NEGATIVE
Comment: NORMAL
Neisseria Gonorrhea: NEGATIVE

## 2022-09-13 ENCOUNTER — Telehealth: Payer: 59

## 2022-09-13 DIAGNOSIS — O039 Complete or unspecified spontaneous abortion without complication: Secondary | ICD-10-CM

## 2022-09-13 NOTE — Progress Notes (Signed)
Scheduled today for new OB intake. Per chart review, pt had confirmed miscarriage in ED on 09/11/22. Will change new OB appt to miscarriage follow up appt. Message sent to provider with notification. Called pt to review, VM left. MyChart message sent.

## 2022-09-19 ENCOUNTER — Ambulatory Visit: Payer: Medicaid Other | Admitting: Obstetrics and Gynecology
